# Patient Record
Sex: Male | Born: 1975
Health system: Southern US, Community
[De-identification: ages and names within clinical notes are randomized; demographics above are authoritative.]

## PROBLEM LIST (undated history)

## (undated) DIAGNOSIS — Z789 Other specified health status: Secondary | ICD-10-CM

## (undated) DIAGNOSIS — I10 Essential (primary) hypertension: Secondary | ICD-10-CM

## (undated) HISTORY — PX: NO PAST SURGERIES: SHX2092

## (undated) HISTORY — DX: Essential (primary) hypertension: I10

## (undated) HISTORY — DX: Other specified health status: Z78.9

---

## 2015-08-05 ENCOUNTER — Encounter (HOSPITAL_BASED_OUTPATIENT_CLINIC_OR_DEPARTMENT_OTHER): Payer: Self-pay

## 2015-08-05 ENCOUNTER — Emergency Department (HOSPITAL_BASED_OUTPATIENT_CLINIC_OR_DEPARTMENT_OTHER)
Admission: EM | Admit: 2015-08-05 | Discharge: 2015-08-05 | Disposition: A | Discharge status: Home / Self Care | Payer: Self-pay | Attending: Emergency Medicine | Admitting: Emergency Medicine

## 2015-08-05 DIAGNOSIS — L03011 Cellulitis of right finger: Secondary | ICD-10-CM | POA: Insufficient documentation

## 2015-08-05 MED ORDER — CEPHALEXIN 500 MG PO CAPS: 500.0000 mg | ORAL_CAPSULE | Freq: Four times a day (QID) | ORAL | Status: DC

## 2015-08-05 MED ORDER — HYDROCODONE-ACETAMINOPHEN 5-325 MG PO TABS: 1.0000 | ORAL_TABLET | ORAL | Status: DC | PRN

## 2015-08-05 MED ORDER — LIDOCAINE HCL (PF) 1 % IJ SOLN
30.0000 mL | Freq: Once | INTRAMUSCULAR | Status: AC
  Administered 2015-08-05: 30 mL
  Filled 2015-08-05: qty 30

## 2015-08-05 MED ORDER — CEPHALEXIN 250 MG PO CAPS
500.0000 mg | ORAL_CAPSULE | Freq: Once | ORAL | Status: AC
  Administered 2015-08-05: 500 mg via ORAL
  Filled 2015-08-05: qty 2

## 2015-08-05 MED FILL — CEPHALEXIN 500 MG CAPSULE: 500 | 5 days supply | Qty: 20 | Fill #0

## 2015-08-05 MED FILL — HYDROCODON-APAP 5-325: 5-325 | 2 days supply | Qty: 6 | Fill #0

## 2015-08-05 NOTE — ED Provider Notes (Signed)
CSN: [id number]     Arrival date & time 08/05/15  1500 History   First MD Initiated Contact with Patient 08/05/15 [phone]     Chief Complaint  Patient presents with  . Nail Problem      HPI  Impression presents for evaluation of a red sore swollen area adjacent his right thumbnail. He does not bite his nails. No past summer episodes. No drainage.  History reviewed. No pertinent past medical history. History reviewed. No pertinent past surgical history. No family history on file. Social History  Substance Use Topics  . Smoking status: Never Smoker   . Smokeless tobacco: None  . Alcohol Use: No    Review of Systems  Constitutional: Negative for fever, chills, diaphoresis, appetite change and fatigue.  HENT: Negative for mouth sores, sore throat and trouble swallowing.   Eyes: Negative for visual disturbance.  Respiratory: Negative for cough, chest tightness, shortness of breath and wheezing.   Cardiovascular: Negative for chest pain.  Gastrointestinal: Negative for nausea, vomiting, abdominal pain, diarrhea and abdominal distention.  Endocrine: Negative for polydipsia, polyphagia and polyuria.  Genitourinary: Negative for dysuria, frequency and hematuria.  Musculoskeletal: Negative for gait problem.  Skin: Positive for color change. Negative for pallor and rash.  Neurological: Negative for dizziness, syncope, light-headedness and headaches.  Hematological: Does not bruise/bleed easily.  Psychiatric/Behavioral: Negative for behavioral problems and confusion.      Allergies  Review of patient's allergies indicates no known allergies.  Home Medications   Prior to Admission medications   Medication Sig Start Date End Date Taking? Authorizing Provider  cephALEXin (KEFLEX) 500 MG capsule Take 1 capsule (500 mg total) by mouth 4 (four) times daily. 08/05/15   Rolland Porter, MD  HYDROcodone-acetaminophen (NORCO/VICODIN) 5-325 MG tablet Take 1 tablet by mouth every 4 (four) hours as  needed. 08/05/15   Rolland Porter, MD   BP 150/102 mmHg  Pulse 88  Temp(Src) 98.4 F (36.9 C) (Oral)  Resp 16  Ht  (1.803 m)  Wt 227 lb (102.967 kg)  BMI 31.67 kg/m2  SpO2 100% Physical Exam  Constitutional: He is oriented to person, place, and time. He appears well-developed and well-nourished. No distress.  HENT:  Head: Normocephalic.  Eyes: Conjunctivae are normal. Pupils are equal, round, and reactive to light. No scleral icterus.  Neck: Normal range of motion. Neck supple. No thyromegaly present.  Cardiovascular: Normal rate and regular rhythm.  Exam reveals no gallop and no friction rub.   No murmur heard. Pulmonary/Chest: Effort normal and breath sounds normal. No respiratory distress. He has no wheezes. He has no rales.  Abdominal: Soft. Bowel sounds are normal. He exhibits no distension. There is no tenderness. There is no rebound.  Musculoskeletal: Normal range of motion.       Hands: Neurological: He is alert and oriented to person, place, and time.  Skin: Skin is warm and dry. No rash noted.  Psychiatric: He has a normal mood and affect. His behavior is normal.    ED Course  Procedures (including critical care time) Labs Review Labs Reviewed - No data to display  Imaging Review No results found. I have personally reviewed and evaluated these images and lab results as part of my medical decision-making.   EKG Interpretation None      MDM   Final diagnoses:  Paronychia, right    INCISION AND DRAINAGE Performed by: Claudean Kinds Consent: Verbal consent obtained. Risks and benefits: risks, benefits and alternatives were discussed Type: abscess  Body area: right thumb paronychium    Anesthesia: local infiltration  Incision was made with a scalpel.  Local anesthetic: lidocaine 1% c epinephrine  Anesthetic total: 4 ml  Complexity: complex Blunt dissection to break up loculations  Drainage: purulent  Drainage amount: scant  Packing  material: 1/4 in iodoform gauze  Patient tolerance: Patient tolerated the procedure well with no immediate complications.       Rolland Porter, MD 08/05/15 [phone]

## 2015-08-05 NOTE — Discharge Instructions (Signed)
Soak and remove the gauze in 24 hours.  Paronychia  Paronychia is an infection of the skin. It happens near a fingernail or toenail. It may cause pain and swelling around the nail. Usually, it is not serious and it clears up with treatment. HOME CARE  Soak the fingers or toes in warm water as told by your doctor. You may be told to do this for 20 minutes, 2-3 times a day.  Keep the area dry when you are not soaking it.  Take medicines only as told by your doctor.  If you were given an antibiotic medicine, finish all of it even if you start to feel better.  Keep the affected area clean.  Do not try to drain a fluid-filled bump yourself.  Wear rubber gloves when putting your hands in water.  Wear gloves if your hands might touch cleaners or chemicals.  Follow your doctor's instructions about:  Wound care.  Bandage (dressing) changes and removal. GET HELP IF:  Your symptoms get worse or do not improve.  You have a fever or chills.  You have redness spreading from the affected area.  You have more fluid, blood, or pus coming from the affected area.  Your finger or knuckle is swollen or is hard to move.   This information is not intended to replace advice given to you by your health care provider. Make sure you discuss any questions you have with your health care provider.   Document Released: 06/08/2009 Document Revised: 11/04/2014 Document Reviewed: 05/28/2014 Elsevier Interactive Patient Education [employer]

## 2015-08-05 NOTE — ED Notes (Signed)
Redness, swelling to right thumb nail area

## 2018-08-13 ENCOUNTER — Other Ambulatory Visit: Payer: Self-pay

## 2018-08-13 ENCOUNTER — Emergency Department (HOSPITAL_BASED_OUTPATIENT_CLINIC_OR_DEPARTMENT_OTHER): Payer: BLUE CROSS/BLUE SHIELD

## 2018-08-13 ENCOUNTER — Encounter (HOSPITAL_BASED_OUTPATIENT_CLINIC_OR_DEPARTMENT_OTHER): Payer: Self-pay

## 2018-08-13 ENCOUNTER — Emergency Department (HOSPITAL_BASED_OUTPATIENT_CLINIC_OR_DEPARTMENT_OTHER)
Admission: EM | Admit: 2018-08-13 | Discharge: 2018-08-13 | Disposition: A | Discharge status: Home / Self Care | Payer: BLUE CROSS/BLUE SHIELD | Attending: Emergency Medicine | Admitting: Emergency Medicine

## 2018-08-13 DIAGNOSIS — Z79899 Other long term (current) drug therapy: Secondary | ICD-10-CM | POA: Diagnosis not present

## 2018-08-13 DIAGNOSIS — R05 Cough: Secondary | ICD-10-CM | POA: Diagnosis not present

## 2018-08-13 DIAGNOSIS — J069 Acute upper respiratory infection, unspecified: Secondary | ICD-10-CM | POA: Diagnosis not present

## 2018-08-13 NOTE — ED Triage Notes (Signed)
Pt arrives POV. Last Wednesday, pt began having body aches, congestion, runny nose. Unproductive cough. No sick contacts. Felt worse today and decided to come get checked.

## 2018-08-13 NOTE — ED Provider Notes (Signed)
[facility] EMERGENCY DEPARTMENT Provider Note   CSN: [id number] Arrival date & time: 08/13/18  7829     History   Chief Complaint Chief Complaint  Patient presents with  . Cough    HPI Tony Thompson is a 43 y.o. male.  HPI  Patient presents with cough.  He has had for the last few days.  Nonproduction.  States he was in the hospital when his wife was treated for an abscess otherwise no direct contacts.  No fevers.  States he feels a little bit of cracking in his chest when he coughs.  Some dull pain.  Has had some muscle aches.  Does not smoke.  Otherwise healthy.  States his blood pressure is been high when he goes to the doctor in the past but usually normalizes after being there for a while.  States he thinks he gets nervous.  History reviewed. No pertinent past medical history.  There are no active problems to display for this patient.   History reviewed. No pertinent surgical history.      Home Medications    Prior to Admission medications   Medication Sig Start Date End Date Taking? Authorizing Provider  cephALEXin (KEFLEX) 500 MG capsule Take 1 capsule (500 mg total) by mouth 4 (four) times daily. 08/05/15   Rolland Porter, MD  HYDROcodone-acetaminophen (NORCO/VICODIN) 5-325 MG tablet Take 1 tablet by mouth every 4 (four) hours as needed. 08/05/15   Rolland Porter, MD    Family History History reviewed. No pertinent family history.  Social History Social History   Tobacco Use  . Smoking status: Never Smoker  Substance Use Topics  . Alcohol use: Yes    Comment: occ  . Drug use: No     Allergies   Patient has no known allergies.   Review of Systems Review of Systems  Constitutional: Negative for appetite change.  HENT: Negative for congestion.   Respiratory: Positive for cough and shortness of breath.   Cardiovascular: Negative for chest pain.  Gastrointestinal: Negative for abdominal pain.  Musculoskeletal: Positive for myalgias. Negative for  back pain.  Skin: Negative for rash.  Neurological: Negative for seizures and weakness.  Psychiatric/Behavioral: Negative for confusion.     Physical Exam Updated Vital Signs BP (!) 154/112 (BP Location: Right Arm)   Pulse 86   Temp 98.6 F (37 C) (Oral)   Resp 18   Ht 5\' 10"  (1.778 m)   Wt 112.5 kg   SpO2 100%   BMI 35.58 kg/m   Physical Exam HENT:     Head: Normocephalic.     Mouth/Throat:     Mouth: Mucous membranes are moist.  Cardiovascular:     Rate and Rhythm: Normal rate.  Pulmonary:     Comments: Harsh breath sounds, worse at right base. Abdominal:     Tenderness: There is no abdominal tenderness.  Musculoskeletal:     Right lower leg: No edema.     Left lower leg: No edema.  Skin:    General: Skin is warm.     Capillary Refill: Capillary refill takes less than 2 seconds.  Neurological:     General: No focal deficit present.     Mental Status: He is alert.      ED Treatments / Results  Labs (all labs ordered are listed, but only abnormal results are displayed) Labs Reviewed - No data to display  EKG None  Radiology Dg Chest 2 View  Result Date: 08/13/2018 CLINICAL DATA:  Cough, body  aches, congestion EXAM: CHEST - 2 VIEW COMPARISON:  None. FINDINGS: The heart size and mediastinal contours are within normal limits. Both lungs are clear. The visualized skeletal structures are unremarkable. IMPRESSION: No acute abnormality of the lungs.  No focal airspace opacity. Electronically Signed   By: Lauralyn PrimesAlex  Bibbey M.D.   On: 08/13/2018 09:28    Procedures Procedures (including critical care time)  Medications Ordered in ED Medications - No data to display   Initial Impression / Assessment and Plan / ED Course  I have reviewed the triage vital signs and the nursing notes.  Pertinent labs & imaging results that were available during my care of the patient were reviewed by me and considered in my medical decision making (see chart for details).      Patient with cough.  URI symptoms.  X-ray reassuring.  Well-appearing.  Discharge home.  Symptomatic treatment.  Blood pressure needs to be followed as an outpatient also and was elevated here.  Final Clinical Impressions(s) / ED Diagnoses   Final diagnoses:  Upper respiratory tract infection, unspecified type    ED Discharge Orders    None       Benjiman CorePickering, Dalyn Kjos, MD 08/13/18 1023

## 2018-08-13 NOTE — ED Notes (Signed)
ED Provider at bedside. 

## 2019-10-03 ENCOUNTER — Ambulatory Visit (INDEPENDENT_AMBULATORY_CARE_PROVIDER_SITE_OTHER): Payer: BC Managed Care – PPO | Admitting: Family Medicine

## 2019-10-03 ENCOUNTER — Other Ambulatory Visit: Payer: Self-pay

## 2019-10-03 ENCOUNTER — Encounter: Payer: Self-pay | Admitting: Family Medicine

## 2019-10-03 VITALS — BP 148/104 | HR 90 | Temp 96.0°F | Ht 70.0 in | Wt 245.4 lb

## 2019-10-03 DIAGNOSIS — R030 Elevated blood-pressure reading, without diagnosis of hypertension: Secondary | ICD-10-CM

## 2019-10-03 DIAGNOSIS — Z0000 Encounter for general adult medical examination without abnormal findings: Secondary | ICD-10-CM | POA: Diagnosis not present

## 2019-10-03 DIAGNOSIS — K649 Unspecified hemorrhoids: Secondary | ICD-10-CM

## 2019-10-03 DIAGNOSIS — Z114 Encounter for screening for human immunodeficiency virus [HIV]: Secondary | ICD-10-CM

## 2019-10-03 LAB — CBC
HCT: 43.8 % (ref 38.5–50.0)
Hemoglobin: 13.7 g/dL (ref 13.2–17.1)
MCH: 21 pg — ABNORMAL LOW (ref 27.0–33.0)
MCHC: 31.3 g/dL — ABNORMAL LOW (ref 32.0–36.0)
MCV: 67.3 fL — ABNORMAL LOW (ref 80.0–100.0)
MPV: 11.4 fL (ref 7.5–12.5)
Platelets: 292 10*3/uL (ref 140–400)
RBC: 6.51 10*6/uL — ABNORMAL HIGH (ref 4.20–5.80)
RDW: 17.5 % — ABNORMAL HIGH (ref 11.0–15.0)
WBC: 7.8 10*3/uL (ref 3.8–10.8)

## 2019-10-03 LAB — COMPREHENSIVE METABOLIC PANEL
AG Ratio: 1.7 (calc) (ref 1.0–2.5)
ALT: 34 U/L (ref 9–46)
AST: 28 U/L (ref 10–40)
Albumin: 4.3 g/dL (ref 3.6–5.1)
Alkaline phosphatase (APISO): 75 U/L (ref 36–130)
BUN: 12 mg/dL (ref 7–25)
CO2: 29 mmol/L (ref 20–32)
Calcium: 9.4 mg/dL (ref 8.6–10.3)
Chloride: 104 mmol/L (ref 98–110)
Creat: 1.3 mg/dL (ref 0.60–1.35)
Globulin: 2.6 g/dL (calc) (ref 1.9–3.7)
Glucose, Bld: 114 mg/dL — ABNORMAL HIGH (ref 65–99)
Potassium: 3.9 mmol/L (ref 3.5–5.3)
Sodium: 141 mmol/L (ref 135–146)
Total Bilirubin: 0.4 mg/dL (ref 0.2–1.2)
Total Protein: 6.9 g/dL (ref 6.1–8.1)

## 2019-10-03 LAB — LIPID PANEL
Cholesterol: 201 mg/dL — ABNORMAL HIGH (ref <200)
HDL: 35 mg/dL — ABNORMAL LOW (ref >=40)
LDL Cholesterol (Calc): 131 mg/dL (calc) — ABNORMAL HIGH
Non-HDL Cholesterol (Calc): 166 mg/dL (calc) — ABNORMAL HIGH (ref <130)
Total CHOL/HDL Ratio: 5.7 (calc) — ABNORMAL HIGH (ref <5.0)
Triglycerides: 210 mg/dL — ABNORMAL HIGH (ref <150)

## 2019-10-03 MED ORDER — HYDROCORTISONE (PERIANAL) 2.5 % EX CREA: 1.0000 "application " | TOPICAL_CREAM | Freq: Two times a day (BID) | CUTANEOUS | 0 refills | Status: DC

## 2019-10-03 MED ORDER — NITROGLYCERIN 0.4 % RE OINT: TOPICAL_OINTMENT | RECTAL | 0 refills | Status: DC

## 2019-10-03 NOTE — Progress Notes (Signed)
Chief Complaint  Patient presents with  . New Patient (Initial Visit)    Well Male Tony Thompson is here for a complete physical.   His last physical was >1 year ago.  Current diet: in general, a "healthy" diet.   Current exercise: treadmill work, wt resistance exercise Weight trend: Has lost intentionally Daytime fatigue? No. Seat belt? Yes.    Health maintenance Tetanus- No HIV- No   Pt w hx of high BP in doc's offices. He does ck BP at home, does not remember readings. Diet/exercise as above.  Pt w hx of hemorrhoids over past 6 mo. Using otc prep H. BM's are better. Sometimes will have blood. No bulges or masses.   Past Medical History:  Diagnosis Date  . No known health problems      Past Surgical History:  Procedure Laterality Date  . NO PAST SURGERIES      Medications  Takes no meds routinely.    Allergies No Known Allergies  Family History Family History  Problem Relation Age of Onset  . Cancer Neg Hx   . Heart disease Neg Hx     Review of Systems: Constitutional: no fevers or chills Eye:  no recent significant change in vision Ear/Nose/Mouth/Throat:  Ears:  no hearing loss Nose/Mouth/Throat:  no complaints of nasal congestion, no sore throat Cardiovascular:  no chest pain Respiratory:  no shortness of breath Gastrointestinal:  no abdominal pain, no change in bowel habits; +hemorrhoid GU:  Male: negative for dysuria, frequency, and incontinence Musculoskeletal/Extremities:  no pain of the joints Integumentary (Skin/Breast):  no abnormal skin lesions reported Neurologic:  no headaches Endocrine: No unexpected weight changes Hematologic/Lymphatic:  no night sweats  Exam BP (!) 148/104 (BP Location: Right Arm, Patient Position: Sitting, Cuff Size: Large)   Pulse 90   Temp (!) 96 F (35.6 C) (Temporal)   Ht 5\' 10"  (1.778 m)   Wt 245 lb 6 oz (111.3 kg)   SpO2 97%   BMI 35.21 kg/m  General:  well developed, well nourished, in no apparent  distress Skin:  no significant moles, warts, or growths Head:  no masses, lesions, or tenderness Eyes:  pupils equal and round, sclera anicteric without injection Ears:  canals without lesions, TMs shiny without retraction, no obvious effusion, no erythema Nose:  nares patent, septum midline, mucosa normal Throat/Pharynx:  lips and gingiva without lesion; tongue and uvula midline; non-inflamed pharynx; no exudates or postnasal drainage Neck: neck supple without adenopathy, thyromegaly, or masses Lungs:  clear to auscultation, breath sounds equal bilaterally, no respiratory distress Cardio:  regular rate and rhythm, no bruits, no LE edema Abdomen:  abdomen soft, nontender; bowel sounds normal; no masses or organomegaly Rectal: small ext hem noted R lat portion of anal opening; no thrombosed lesions Musculoskeletal:  symmetrical muscle groups noted without atrophy or deformity Extremities:  no clubbing, cyanosis, or edema, no deformities, no skin discoloration Neuro:  gait normal; deep tendon reflexes normal and symmetric Psych: well oriented with normal range of affect and appropriate judgment/insight  Assessment and Plan  Well adult exam - Plan: CBC, Comprehensive metabolic panel, Lipid panel, CANCELED: CBC, CANCELED: Comprehensive metabolic panel, CANCELED: Lipid panel  Screening for HIV (human immunodeficiency virus) - Plan: HIV Antibody (routine testing w rflx)  Elevated blood pressure reading  Hemorrhoids, unspecified hemorrhoid type - Plan: Nitroglycerin 0.4 % OINT, hydrocortisone (ANUSOL-HC) 2.5 % rectal cream   Well 44 y.o. male. Counseled on diet and exercise. Counseled on risks and benefits of prostate cancer  screening with PSA. The patient agrees to forego screening.  F/u in 2 weeks for nurse visit to reck BP. Will get tdap 2 weeks after his 2nd covid vaccination which he plans to get soon.  Cream as above. If no improvement, will refer to GI.  Other orders as  above. Follow up in 1 year pending the above workup and BP results. The patient voiced understanding and agreement to the plan.  Jilda Roche [place], DO 10/03/19 3:35 PM

## 2019-10-03 NOTE — Patient Instructions (Addendum)
Keep the diet clean and stay active.  Give [place] 2-3 business days to get the results of your labs back.   Get your tetanus shot 2 weeks after your second covid vaccination.   Let us know if you need anything.   Hemorrhoids Hemorrhoids are swollen veins in and around the rectum or anus. There are two types of hemorrhoids:  Internal hemorrhoids. These occur in the veins that are just inside the rectum. They may poke through to the outside and become irritated and painful.  External hemorrhoids. These occur in the veins that are outside the anus and can be felt as a painful swelling or hard lump near the anus. Most hemorrhoids do not cause serious problems, and they can be managed with home treatments such as diet and lifestyle changes. If home treatments do not help the symptoms, procedures can be done to shrink or remove the hemorrhoids. What are the causes? This condition is caused by increased pressure in the anal area. This pressure may result from various things, including:  Constipation.  Straining to have a bowel movement.  Diarrhea.  Pregnancy.  Obesity.  Sitting for long periods of time.  Heavy lifting or other activity that causes you to strain.  Anal sex.  Riding a bike for a long period of time. What are the signs or symptoms? Symptoms of this condition include:  Pain.  Anal itching or irritation.  Rectal bleeding.  Leakage of stool (feces).  Anal swelling.  One or more lumps around the anus. How is this diagnosed? This condition can often be diagnosed through a visual exam. Other exams or tests may also be done, such as:  An exam that involves feeling the rectal area with a gloved hand (digital rectal exam).  An exam of the anal canal that is done using a small tube (anoscope).  A blood test, if you have lost a significant amount of blood.  A test to look inside the colon using a flexible tube with a camera on the end (sigmoidoscopy or  colonoscopy). How is this treated? This condition can usually be treated at home. However, various procedures may be done if dietary changes, lifestyle changes, and other home treatments do not help your symptoms. These procedures can help make the hemorrhoids smaller or remove them completely. Some of these procedures involve surgery, and others do not. Common procedures include:  Rubber band ligation. Rubber bands are placed at the base of the hemorrhoids to cut off their blood supply.  Sclerotherapy. Medicine is injected into the hemorrhoids to shrink them.  Infrared coagulation. A type of light energy is used to get rid of the hemorrhoids.  Hemorrhoidectomy surgery. The hemorrhoids are surgically removed, and the veins that supply them are tied off.  Stapled hemorrhoidopexy surgery. The surgeon staples the base of the hemorrhoid to the rectal wall. Follow these instructions at home: Eating and drinking   Eat foods that have a lot of fiber in them, such as whole grains, beans, nuts, fruits, and vegetables.  Ask your health care provider about taking products that have added fiber (fiber supplements).  Reduce the amount of fat in your diet. You can do this by eating low-fat dairy products, eating less red meat, and avoiding processed foods.  Drink enough fluid to keep your urine pale yellow. Managing pain and swelling   Take warm sitz baths for 20 minutes, 3-4 times a day to ease pain and discomfort. You may do this in a bathtub or using a  portable sitz bath that fits over the toilet.  If directed, apply ice to the affected area. Using ice packs between sitz baths may be helpful. ? Put ice in a plastic bag. ? Place a towel between your skin and the bag. ? Leave the ice on for 20 minutes, 2-3 times a day. General instructions  Take over-the-counter and prescription medicines only as told by your health care provider.  Use medicated creams or suppositories as told.  Get regular  exercise. Ask your health care provider how much and what kind of exercise is best for you. In general, you should do moderate exercise for at least 30 minutes on most days of the week (150 minutes each week). This can include activities such as walking, biking, or yoga.  Go to the bathroom when you have the urge to have a bowel movement. Do not wait.  Avoid straining to have bowel movements.  Keep the anal area dry and clean. Use wet toilet paper or moist towelettes after a bowel movement.  Do not sit on the toilet for long periods of time. This increases blood pooling and pain.  Keep all follow-up visits as told by your health care provider. This is important. Contact a health care provider if you have:  Increasing pain and swelling that are not controlled by treatment or medicine.  Difficulty having a bowel movement, or you are unable to have a bowel movement.  Pain or inflammation outside the area of the hemorrhoids. Get help right away if you have:  Uncontrolled bleeding from your rectum. Summary  Hemorrhoids are swollen veins in and around the rectum or anus.  Most hemorrhoids can be managed with home treatments such as diet and lifestyle changes.  Taking warm sitz baths can help ease pain and discomfort.  In severe cases, procedures or surgery can be done to shrink or remove the hemorrhoids. This information is not intended to replace advice given to you by your health care provider. Make sure you discuss any questions you have with your health care provider. Document Revised: 11/16/2018 Document Reviewed: 11/09/2017 Elsevier Patient Education  2020 [employer].

## 2019-10-04 LAB — HIV ANTIBODY (ROUTINE TESTING W REFLEX): HIV 1&2 Ab, 4th Generation: NONREACTIVE

## 2019-10-07 ENCOUNTER — Other Ambulatory Visit: Payer: Self-pay | Admitting: Family Medicine

## 2019-10-07 DIAGNOSIS — R739 Hyperglycemia, unspecified: Secondary | ICD-10-CM

## 2019-10-07 DIAGNOSIS — E611 Iron deficiency: Secondary | ICD-10-CM

## 2019-10-07 DIAGNOSIS — R718 Other abnormality of red blood cells: Secondary | ICD-10-CM

## 2019-10-07 DIAGNOSIS — E785 Hyperlipidemia, unspecified: Secondary | ICD-10-CM

## 2019-10-17 ENCOUNTER — Ambulatory Visit: Payer: BC Managed Care – PPO

## 2019-11-20 ENCOUNTER — Other Ambulatory Visit: Payer: BC Managed Care – PPO

## 2020-10-05 ENCOUNTER — Encounter: Payer: Self-pay | Admitting: Family Medicine

## 2020-10-05 ENCOUNTER — Ambulatory Visit (INDEPENDENT_AMBULATORY_CARE_PROVIDER_SITE_OTHER): Payer: BC Managed Care – PPO | Admitting: Family Medicine

## 2020-10-05 ENCOUNTER — Other Ambulatory Visit: Payer: Self-pay

## 2020-10-05 VITALS — BP 136/82 | HR 60 | Temp 98.2°F | Ht 71.0 in | Wt 256.5 lb

## 2020-10-05 DIAGNOSIS — R739 Hyperglycemia, unspecified: Secondary | ICD-10-CM

## 2020-10-05 DIAGNOSIS — Z3009 Encounter for other general counseling and advice on contraception: Secondary | ICD-10-CM

## 2020-10-05 DIAGNOSIS — Z125 Encounter for screening for malignant neoplasm of prostate: Secondary | ICD-10-CM | POA: Diagnosis not present

## 2020-10-05 DIAGNOSIS — R718 Other abnormality of red blood cells: Secondary | ICD-10-CM | POA: Diagnosis not present

## 2020-10-05 DIAGNOSIS — K644 Residual hemorrhoidal skin tags: Secondary | ICD-10-CM

## 2020-10-05 DIAGNOSIS — Z0000 Encounter for general adult medical examination without abnormal findings: Secondary | ICD-10-CM | POA: Diagnosis not present

## 2020-10-05 DIAGNOSIS — Z1159 Encounter for screening for other viral diseases: Secondary | ICD-10-CM | POA: Diagnosis not present

## 2020-10-05 NOTE — Progress Notes (Signed)
Chief Complaint  Patient presents with  . Annual Exam    Well Male Tony Thompson is here for a complete physical.   His last physical was >1 year ago.  Current diet: in general, diet is fair.   Current exercise: running, walking, stretching, wt lifting Weight trend: gained osme Fatigue out of ordinary? No. Seat belt? Yes.    External hemorrhoids The patient has a longstanding history of external hemorrhoids.  He was prescribed topical nitro ointment and 2.5% hydrocortisone last year.  Only the latter was affordable which did help initially.  Over the past several months it is gotten worse again.  He is requesting to see a specialist.  Bowel movements have been normal overall.  Health maintenance Tetanus- Yes HIV- Yes Hep C- No  Past Medical History:  Diagnosis Date  . No known health problems      Past Surgical History:  Procedure Laterality Date  . NO PAST SURGERIES      Medications  Takes no meds routinely.   Allergies No Known Allergies  Family History Family History  Problem Relation Age of Onset  . Cancer Neg Hx   . Heart disease Neg Hx     Review of Systems: Constitutional: no fevers or chills Eye:  no recent significant change in vision Ear/Nose/Mouth/Throat:  Ears:  no hearing loss Nose/Mouth/Throat:  no complaints of nasal congestion, no sore throat Cardiovascular:  no chest pain Respiratory:  no shortness of breath Gastrointestinal:  +painful ext hemorrhoids GU:  Male: negative for dysuria, frequency, and incontinence Musculoskeletal/Extremities:  no pain of the joints Integumentary (Skin/Breast):  no abnormal skin lesions reported Neurologic:  no headaches Endocrine: No unexpected weight changes Hematologic/Lymphatic:  no night sweats  Exam BP 136/82 (BP Location: Left Arm, Patient Position: Sitting, Cuff Size: Large)   Pulse 60   Temp 98.2 F (36.8 C) (Oral)   Ht 5\' 11"  (1.803 m)   Wt 256 lb 8 oz (116.3 kg)   SpO2 99%   BMI 35.77 kg/m   General:  well developed, well nourished, in no apparent distress Skin:  no significant moles, warts, or growths Head:  no masses, lesions, or tenderness Eyes:  pupils equal and round, sclera anicteric without injection Ears:  canals without lesions, TMs shiny without retraction, no obvious effusion, no erythema Nose:  nares patent, septum midline, mucosa normal Throat/Pharynx:  lips and gingiva without lesion; tongue and uvula midline; non-inflamed pharynx; no exudates or postnasal drainage Neck: neck supple without adenopathy, thyromegaly, or masses Lungs:  clear to auscultation, breath sounds equal bilaterally, no respiratory distress Cardio:  regular rate and rhythm, no bruits, no LE edema Abdomen:  abdomen soft, nontender; bowel sounds normal; no masses or organomegaly Rectal: Deferred Musculoskeletal:  symmetrical muscle groups noted without atrophy or deformity Extremities:  no clubbing, cyanosis, or edema, no deformities, no skin discoloration Neuro:  gait normal; deep tendon reflexes normal and symmetric Psych: well oriented with normal range of affect and appropriate judgment/insight  Assessment and Plan  Well adult exam - Plan: Lipid panel  Microcytosis - Plan: CBC, IBC + Ferritin  Hyperglycemia - Plan: Comprehensive metabolic panel, Hemoglobin A1c  External hemorrhoid - Plan: Ambulatory referral to Gastroenterology  Screening for prostate cancer - Plan: PSA  Encounter for hepatitis C screening test for low risk patient - Plan: Hepatitis C antibody  Vasectomy evaluation - Plan: Ambulatory referral to Urology   Well 45 y.o. male. Counseled on diet and exercise. Counseled on risks and benefits of prostate cancer  screening with PSA. The patient agrees to undergo screening.  Recheck above labs. Patient requests referral to urology for a vasectomy. With his hemorrhoids, will recommend seeing Dr. Barron Alvine of the GI team. Follow up in 1 yr pending the above workup. The  patient voiced understanding and agreement to the plan.  Jilda Roche [place], DO 10/05/20 3:36 PM

## 2020-10-05 NOTE — Patient Instructions (Addendum)
Goal blood pressure <140/90. Continue to check blood pressures at home intermittently. If it increases, start checking 2-3 times per week and vary the time of day that you check it.   If you do not hear anything about your referrals in the next 1-2 weeks, call our office and ask for an update.  Keep the diet clean and stay active.  Give [place] 2-3 business days to get the results of your labs back.   Let us know if you need anything.

## 2020-10-06 LAB — COMPREHENSIVE METABOLIC PANEL
ALT: 26 U/L (ref 0–53)
AST: 24 U/L (ref 0–37)
Albumin: 4.4 g/dL (ref 3.5–5.2)
Alkaline Phosphatase: 73 U/L (ref 39–117)
BUN: 16 mg/dL (ref 6–23)
CO2: 29 mEq/L (ref 19–32)
Calcium: 9.6 mg/dL (ref 8.4–10.5)
Chloride: 103 mEq/L (ref 96–112)
Creatinine, Ser: 1.17 mg/dL (ref 0.40–1.50)
GFR: 76.16 mL/min (ref >60.00)
Glucose, Bld: 93 mg/dL (ref 70–99)
Potassium: 4.1 mEq/L (ref 3.5–5.1)
Sodium: 141 mEq/L (ref 135–145)
Total Bilirubin: 0.4 mg/dL (ref 0.2–1.2)
Total Protein: 7 g/dL (ref 6.0–8.3)

## 2020-10-06 LAB — CBC
HCT: 41.7 % (ref 39.0–52.0)
Hemoglobin: 13.5 g/dL (ref 13.0–17.0)
MCHC: 32.5 g/dL (ref 30.0–36.0)
MCV: 66.1 fl — ABNORMAL LOW (ref 78.0–100.0)
Platelets: 232 10*3/uL (ref 150.0–400.0)
RBC: 6.3 Mil/uL — ABNORMAL HIGH (ref 4.22–5.81)
RDW: 15.4 % (ref 11.5–15.5)
WBC: 8 10*3/uL (ref 4.0–10.5)

## 2020-10-06 LAB — LIPID PANEL
Cholesterol: 195 mg/dL (ref 0–200)
HDL: 35.4 mg/dL — ABNORMAL LOW (ref >39.00)
NonHDL: 159.71
Total CHOL/HDL Ratio: 6
Triglycerides: 233 mg/dL — ABNORMAL HIGH (ref 0.0–149.0)
VLDL: 46.6 mg/dL — ABNORMAL HIGH (ref 0.0–40.0)

## 2020-10-06 LAB — IBC + FERRITIN
Ferritin: 83.8 ng/mL (ref 22.0–322.0)
Iron: 63 ug/dL (ref 42–165)
Saturation Ratios: 21.7 % (ref 20.0–50.0)
Transferrin: 207 mg/dL — ABNORMAL LOW (ref 212.0–360.0)

## 2020-10-06 LAB — PSA: PSA: 0.79 ng/mL (ref 0.10–4.00)

## 2020-10-06 LAB — HEPATITIS C ANTIBODY
Hepatitis C Ab: NONREACTIVE
SIGNAL TO CUT-OFF: 0.03 (ref <1.00)

## 2020-10-06 LAB — HEMOGLOBIN A1C: Hgb A1c MFr Bld: 6.2 % (ref 4.6–6.5)

## 2020-10-06 LAB — LDL CHOLESTEROL, DIRECT: Direct LDL: 131 mg/dL

## 2020-10-07 ENCOUNTER — Other Ambulatory Visit: Payer: Self-pay | Admitting: Family Medicine

## 2020-10-07 DIAGNOSIS — K644 Residual hemorrhoidal skin tags: Secondary | ICD-10-CM

## 2020-11-10 ENCOUNTER — Ambulatory Visit: Payer: BC Managed Care – PPO | Admitting: Family Medicine

## 2020-11-17 DIAGNOSIS — M9903 Segmental and somatic dysfunction of lumbar region: Secondary | ICD-10-CM | POA: Diagnosis not present

## 2020-11-17 DIAGNOSIS — M5413 Radiculopathy, cervicothoracic region: Secondary | ICD-10-CM | POA: Diagnosis not present

## 2020-11-17 DIAGNOSIS — M9901 Segmental and somatic dysfunction of cervical region: Secondary | ICD-10-CM | POA: Diagnosis not present

## 2020-11-17 DIAGNOSIS — M5417 Radiculopathy, lumbosacral region: Secondary | ICD-10-CM | POA: Diagnosis not present

## 2020-11-24 DIAGNOSIS — K641 Second degree hemorrhoids: Secondary | ICD-10-CM | POA: Diagnosis not present

## 2020-12-29 DIAGNOSIS — Z3009 Encounter for other general counseling and advice on contraception: Secondary | ICD-10-CM | POA: Diagnosis not present

## 2021-02-10 ENCOUNTER — Encounter (HOSPITAL_BASED_OUTPATIENT_CLINIC_OR_DEPARTMENT_OTHER): Payer: Self-pay | Admitting: Emergency Medicine

## 2021-02-10 ENCOUNTER — Emergency Department (HOSPITAL_BASED_OUTPATIENT_CLINIC_OR_DEPARTMENT_OTHER): Payer: BC Managed Care – PPO

## 2021-02-10 ENCOUNTER — Other Ambulatory Visit: Payer: Self-pay

## 2021-02-10 ENCOUNTER — Emergency Department (HOSPITAL_BASED_OUTPATIENT_CLINIC_OR_DEPARTMENT_OTHER)
Admission: EM | Admit: 2021-02-10 | Discharge: 2021-02-10 | Disposition: A | Discharge status: Home / Self Care | Payer: BC Managed Care – PPO | Attending: Emergency Medicine | Admitting: Emergency Medicine

## 2021-02-10 DIAGNOSIS — W208XXA Other cause of strike by thrown, projected or falling object, initial encounter: Secondary | ICD-10-CM | POA: Insufficient documentation

## 2021-02-10 DIAGNOSIS — S9031XA Contusion of right foot, initial encounter: Secondary | ICD-10-CM | POA: Diagnosis not present

## 2021-02-10 DIAGNOSIS — Y990 Civilian activity done for income or pay: Secondary | ICD-10-CM | POA: Diagnosis not present

## 2021-02-10 DIAGNOSIS — T148XXA Other injury of unspecified body region, initial encounter: Secondary | ICD-10-CM

## 2021-02-10 DIAGNOSIS — S99921A Unspecified injury of right foot, initial encounter: Secondary | ICD-10-CM | POA: Insufficient documentation

## 2021-02-10 DIAGNOSIS — Y93B3 Activity, free weights: Secondary | ICD-10-CM | POA: Diagnosis not present

## 2021-02-10 DIAGNOSIS — M7989 Other specified soft tissue disorders: Secondary | ICD-10-CM | POA: Diagnosis not present

## 2021-02-10 DIAGNOSIS — S99929A Unspecified injury of unspecified foot, initial encounter: Secondary | ICD-10-CM

## 2021-02-10 NOTE — ED Notes (Signed)
Pt provided discharge instructions and prescription information. Pt was given the opportunity to ask questions and questions were answered. Discharge signature not obtained in the setting of the COVID-19 pandemic in order to reduce high touch surfaces.  ° °

## 2021-02-10 NOTE — Discharge Instructions (Addendum)
Get help right away if: You suddenly develop severe pain in your foot. You previously had sensation in your foot and you suddenly lose sensation. Your symptoms had improved and they suddenly get worse. Your foot or toes are turning pink or blue.

## 2021-02-10 NOTE — ED Triage Notes (Signed)
Pt reports dumbbell broke last night while working out.Pt reports dumbbell was approximately 50 lbs and reports half fell on right foot. No obvious deformity noted, moderate swelling noted to top of right foot.

## 2021-02-10 NOTE — ED Provider Notes (Signed)
[facility] EMERGENCY DEPARTMENT Provider Note   CSN: [id number] Arrival date & time: 02/10/21  1019     History Chief Complaint  Patient presents with   Foot Pain    Tony Thompson is a 45 y.o. male   Foot Pain This is a new problem. The current episode started 12 to 24 hours ago. The problem occurs constantly. The problem has not changed since onset.Pertinent negatives include no chest pain, no abdominal pain, no headaches and no shortness of breath. The symptoms are aggravated by walking. Nothing relieves the symptoms. He has tried a cold compress for the symptoms.      Past Medical History:  Diagnosis Date   No known health problems     There are no problems to display for this patient.   Past Surgical History:  Procedure Laterality Date   NO PAST SURGERIES         Family History  Problem Relation Age of Onset   Cancer Neg Hx    Heart disease Neg Hx     Social History   Tobacco Use   Smoking status: Never   Smokeless tobacco: Never  Substance Use Topics   Alcohol use: Yes    Comment: occ   Drug use: No    Home Medications Prior to Admission medications   Medication Sig Start Date End Date Taking? Authorizing Provider  hydrocortisone (ANUSOL-HC) 2.5 % rectal cream Place 1 application rectally 2 (two) times daily. Patient not taking: Reported on 10/05/2020 10/03/19   Sharlene Dory, DO  Nitroglycerin 0.4 % OINT Apply a thin layer twice daily. Patient not taking: Reported on 10/05/2020 10/03/19   Sharlene Dory, DO    Allergies    Patient has no known allergies.  Review of Systems   Review of Systems  Respiratory:  Negative for shortness of breath.   Cardiovascular:  Negative for chest pain.  Gastrointestinal:  Negative for abdominal pain.  Neurological:  Negative for weakness, numbness and headaches.   Physical Exam Updated Vital Signs BP (!) 152/99 (BP Location: Right Arm)   Pulse 61   Temp 98.2 F (36.8 C) (Oral)    Resp 18   Ht 5\' 11"  (1.803 m)   Wt 109.3 kg   SpO2 100%   BMI 33.61 kg/m   Physical Exam Vitals and nursing note reviewed.  Constitutional:      General: He is not in acute distress.    Appearance: He is well-developed. He is not diaphoretic.  HENT:     Head: Normocephalic and atraumatic.  Eyes:     General: No scleral icterus.    Conjunctiva/sclera: Conjunctivae normal.  Cardiovascular:     Rate and Rhythm: Normal rate and regular rhythm.     Heart sounds: Normal heart sounds.  Pulmonary:     Effort: Pulmonary effort is normal. No respiratory distress.     Breath sounds: Normal breath sounds.  Abdominal:     Palpations: Abdomen is soft.     Tenderness: There is no abdominal tenderness.  Musculoskeletal:     Cervical back: Normal range of motion and neck supple.     Comments: Marked swelling of the dorsum of the right foot mild ecchymosis, normal capillary refill, 2+ distal DP and PT pulse  Skin:    General: Skin is warm and dry.  Neurological:     General: No focal deficit present.     Mental Status: He is alert.  Psychiatric:  Behavior: Behavior normal.    ED Results / Procedures / Treatments   Labs (all labs ordered are listed, but only abnormal results are displayed) Labs Reviewed - No data to display  EKG None  Radiology DG Foot Complete Right  Result Date: 02/10/2021 CLINICAL DATA:  Dumbbell fell on foot EXAM: RIGHT FOOT COMPLETE - 3+ VIEW COMPARISON:  None. FINDINGS: There is no acute fracture or dislocation. Alignment is normal. The Lisfranc and Chopart joints are intact. The joint spaces are preserved. There is marked soft tissue swelling over the dorsum of the foot. IMPRESSION: Marked soft tissue swelling over the dorsum of the foot without underlying fracture or dislocation. Electronically Signed   By: Lesia Hausen MD   On: 02/10/2021 11:22    Procedures Procedures   Medications Ordered in ED Medications - No data to display  ED Course  I  have reviewed the triage vital signs and the nursing notes.  Pertinent labs & imaging results that were available during my care of the patient were reviewed by me and considered in my medical decision making (see chart for details).    MDM Rules/Calculators/A&P                         I personally reviewed the patient's foot x-ray.  No evidence of Lisfranc injury or fracture. Patient has a large hematoma.  Given a postop boot with improvement in his ambulatory status.  Ice and elevation as well as Tylenol Motrin for pain.  No evidence of compartment syndrome, normal DP and PT pulse with good cap refill.  He has normal sensation appears otherwise appropriate for discharge and may follow-up with his primary care physician.   Final Clinical Impression(s) / ED Diagnoses Final diagnoses:  Foot injury    Rx / DC Orders ED Discharge Orders     None        Arthor Captain, PA-C 02/10/21 1500    Gloris Manchester, MD 02/11/21 [phone]

## 2021-02-17 ENCOUNTER — Inpatient Hospital Stay: Payer: BC Managed Care – PPO | Admitting: Family Medicine

## 2021-04-16 DIAGNOSIS — Z302 Encounter for sterilization: Secondary | ICD-10-CM | POA: Diagnosis not present

## 2021-10-06 ENCOUNTER — Ambulatory Visit (INDEPENDENT_AMBULATORY_CARE_PROVIDER_SITE_OTHER): Payer: BC Managed Care – PPO | Admitting: Family Medicine

## 2021-10-06 ENCOUNTER — Encounter: Payer: Self-pay | Admitting: Family Medicine

## 2021-10-06 VITALS — BP 142/88 | HR 47 | Temp 98.0°F | Ht 70.0 in | Wt 250.0 lb

## 2021-10-06 DIAGNOSIS — Z0000 Encounter for general adult medical examination without abnormal findings: Secondary | ICD-10-CM

## 2021-10-06 DIAGNOSIS — I10 Essential (primary) hypertension: Secondary | ICD-10-CM | POA: Diagnosis not present

## 2021-10-06 DIAGNOSIS — Z1211 Encounter for screening for malignant neoplasm of colon: Secondary | ICD-10-CM | POA: Diagnosis not present

## 2021-10-06 DIAGNOSIS — Z125 Encounter for screening for malignant neoplasm of prostate: Secondary | ICD-10-CM

## 2021-10-06 MED ORDER — AMLODIPINE BESYLATE 5 MG PO TABS: 5.0000 mg | ORAL_TABLET | Freq: Every day | ORAL | 3 refills | Status: DC

## 2021-10-06 NOTE — Patient Instructions (Addendum)
Give [place] 2-3 business days to get the results of your labs back.  ? ?Keep the diet clean and stay active. ? ?Please get me a copy of your advanced directive form at your convenience.  ? ?Consider Powerstep insoles. There are very quality over the counter inserts. Shop around online and in stores. Dr. Margart Sickles is a cheaper alternative, though is not as high of quality.  ? ?Check your blood pressures 2-3 times per week, alternating the time of day you check it. If it is high, considering waiting 1-2 minutes and rechecking. If it gets higher, your anxiety is likely creeping up and we should avoid rechecking.  ? ?If you do not hear anything about your referral in the next 1-2 weeks, call our office and ask for an update. ? ?Let us know if you need anything. ? ?

## 2021-10-06 NOTE — Progress Notes (Signed)
Chief Complaint  ?Patient presents with  ? Annual Exam  ? ? ?Well Male ?Tony Thompson is here for a complete physical.   ?His last physical was >1 year ago.  ?Current diet: in general, a "fair" diet.   ?Current exercise: walking ?Weight trend: has gained a little ?Fatigue out of ordinary? No. ?Seat belt? Yes.   ?Advanced directive? No ? ?Health maintenance ?Tetanus- Yes ?HIV- Yes ?Hep C- Yes ? ?Hypertension ?Patient presents for hypertension follow up. ?He does not monitor home blood pressures. ?He is not on medications. ?Patient has these side effects of medication: none ?Diet/exercise as above. ?No Cp or SOB.  ? ?Past Medical History:  ?Diagnosis Date  ? No known health problems   ?  ? ?Past Surgical History:  ?Procedure Laterality Date  ? NO PAST SURGERIES    ? ? ?Medications  ?Takes no meds routinely.   ? ?Allergies ?No Known Allergies ? ?Family History ?Family History  ?Problem Relation Age of Onset  ? Cancer Neg Hx   ? Heart disease Neg Hx   ? ? ?Review of Systems: ?Constitutional: no fevers or chills ?Eye:  no recent significant change in vision ?Ear/Nose/Mouth/Throat:  Ears:  no hearing loss ?Nose/Mouth/Throat:  no complaints of nasal congestion, no sore throat ?Cardiovascular:  no chest pain ?Respiratory:  no shortness of breath ?Gastrointestinal:  no abdominal pain, no change in bowel habits ?GU:  Male: negative for dysuria, frequency, and incontinence ?Musculoskeletal/Extremities:  no pain of the joints ?Integumentary (Skin/Breast):  no abnormal skin lesions reported ?Neurologic:  no headaches ?Endocrine: No unexpected weight changes ?Hematologic/Lymphatic:  no night sweats ? ?Exam ?BP (!) 142/88   Pulse (!) 47   Temp 98 ?F (36.7 ?C) (Oral)   Ht 5\' 10"  (1.778 m)   Wt 250 lb (113.4 kg)   SpO2 99%   BMI 35.87 kg/m?  ?General:  well developed, well nourished, in no apparent distress ?Skin:  no significant moles, warts, or growths ?Head:  no masses, lesions, or tenderness ?Eyes:  pupils equal and  round, sclera anicteric without injection ?Ears:  canals without lesions, TMs shiny without retraction, no obvious effusion, no erythema ?Nose:  nares patent, septum midline, mucosa normal ?Throat/Pharynx:  lips and gingiva without lesion; tongue and uvula midline; non-inflamed pharynx; no exudates or postnasal drainage ?Neck: neck supple without adenopathy, thyromegaly, or masses ?Lungs:  clear to auscultation, breath sounds equal bilaterally, no respiratory distress ?Cardio:  regular rate and rhythm, no bruits, no LE edema ?Abdomen:  abdomen soft, nontender; bowel sounds normal; no masses or organomegaly ?Rectal: Deferred ?Musculoskeletal:  symmetrical muscle groups noted without atrophy or deformity ?Extremities:  no clubbing, cyanosis, or edema, no deformities, no skin discoloration ?Neuro:  gait normal; deep tendon reflexes normal and symmetric ?Psych: well oriented with normal range of affect and appropriate judgment/insight ? ?Assessment and Plan ? ?Well adult exam - Plan: CBC, Comprehensive metabolic panel, Lipid panel ? ?Screening for prostate cancer - Plan: PSA ? ?Essential hypertension - Plan: amLODipine (NORVASC) 5 MG tablet ? ?Screen for colon cancer - Plan: Ambulatory referral to Gastroenterology  ? ?Well 46 y.o. male. ?Counseled on diet and exercise. ?Counseled on risks and benefits of prostate cancer screening with PSA. The patient agrees to undergo screening.  ?Advanced directive form provided today.  ?Other orders as above. ?Start referral process for setting up colonoscopy.  ?HTN: Chronic, uncontrolled. Start Norvasc 5 mg/d. Monitor BP at home. Reck in 1 mo.  ?The patient voiced understanding and agreement to the plan. ? ?  Jilda Roche Loyal, DO ?10/06/21 ?2:16 PM ? ?

## 2021-11-05 ENCOUNTER — Ambulatory Visit (INDEPENDENT_AMBULATORY_CARE_PROVIDER_SITE_OTHER): Payer: BC Managed Care – PPO | Admitting: Family Medicine

## 2021-11-05 ENCOUNTER — Encounter: Payer: Self-pay | Admitting: Family Medicine

## 2021-11-05 VITALS — BP 146/82 | HR 69 | Temp 98.0°F | Ht 70.0 in | Wt 255.5 lb

## 2021-11-05 DIAGNOSIS — Z0000 Encounter for general adult medical examination without abnormal findings: Secondary | ICD-10-CM | POA: Diagnosis not present

## 2021-11-05 DIAGNOSIS — I10 Essential (primary) hypertension: Secondary | ICD-10-CM | POA: Diagnosis not present

## 2021-11-05 DIAGNOSIS — Z125 Encounter for screening for malignant neoplasm of prostate: Secondary | ICD-10-CM | POA: Diagnosis not present

## 2021-11-05 LAB — COMPREHENSIVE METABOLIC PANEL
ALT: 32 U/L (ref 0–53)
AST: 20 U/L (ref 0–37)
Albumin: 4.4 g/dL (ref 3.5–5.2)
Alkaline Phosphatase: 90 U/L (ref 39–117)
BUN: 12 mg/dL (ref 6–23)
CO2: 31 mEq/L (ref 19–32)
Calcium: 9.2 mg/dL (ref 8.4–10.5)
Chloride: 102 mEq/L (ref 96–112)
Creatinine, Ser: 1.08 mg/dL (ref 0.40–1.50)
GFR: 83.2 mL/min (ref >60.00)
Glucose, Bld: 95 mg/dL (ref 70–99)
Potassium: 4.3 mEq/L (ref 3.5–5.1)
Sodium: 139 mEq/L (ref 135–145)
Total Bilirubin: 0.4 mg/dL (ref 0.2–1.2)
Total Protein: 7.1 g/dL (ref 6.0–8.3)

## 2021-11-05 LAB — LIPID PANEL
Cholesterol: 211 mg/dL — ABNORMAL HIGH (ref 0–200)
HDL: 41.1 mg/dL (ref >39.00)
LDL Cholesterol: 143 mg/dL — ABNORMAL HIGH (ref 0–99)
NonHDL: 169.76
Total CHOL/HDL Ratio: 5
Triglycerides: 133 mg/dL (ref 0.0–149.0)
VLDL: 26.6 mg/dL (ref 0.0–40.0)

## 2021-11-05 LAB — CBC
HCT: 42.8 % (ref 39.0–52.0)
Hemoglobin: 13.7 g/dL (ref 13.0–17.0)
MCHC: 31.9 g/dL (ref 30.0–36.0)
MCV: 65.5 fl — ABNORMAL LOW (ref 78.0–100.0)
Platelets: 250 10*3/uL (ref 150.0–400.0)
RBC: 6.53 Mil/uL — ABNORMAL HIGH (ref 4.22–5.81)
RDW: 14.9 % (ref 11.5–15.5)
WBC: 9.1 10*3/uL (ref 4.0–10.5)

## 2021-11-05 LAB — PSA: PSA: 0.68 ng/mL (ref 0.10–4.00)

## 2021-11-05 MED ORDER — AMLODIPINE BESYLATE 10 MG PO TABS: 10.0000 mg | ORAL_TABLET | Freq: Every day | ORAL | 3 refills | Status: DC

## 2021-11-05 NOTE — Progress Notes (Signed)
Chief Complaint  ?Patient presents with  ? Follow-up  ?  Do labs today ?Blood pressure medication  ? ? ?Subjective ?Tony Thompson is a 46 y.o. male who presents for hypertension follow up. ?He does monitor home blood pressures. ?Blood pressures ranging from 120-140's/80-90's on average. ?He is compliant with medication- Norvasc 5 mg/d. ?Patient has these side effects of medication: none ?He is sometimes adhering to a healthy diet overall. ?Current exercise: walking ?No Cp or SOB.  ?  ?Past Medical History:  ?Diagnosis Date  ? No known health problems   ? ? ?Exam ?BP (!) 146/82 (BP Location: Left Arm, Cuff Size: Large)   Pulse 69   Temp 98 ?F (36.7 ?C) (Oral)   Ht 5\' 10"  (1.778 m)   Wt 255 lb 8 oz (115.9 kg)   SpO2 99%   BMI 36.66 kg/m?  ?General:  well developed, well nourished, in no apparent distress ?Heart: RRR, no bruits, no LE edema ?Lungs: clear to auscultation, no accessory muscle use ?Psych: well oriented with normal range of affect and appropriate judgment/insight ? ?Essential hypertension ? ?Screening for prostate cancer - Plan: PSA ? ?Well adult exam - Plan: CBC, Comprehensive metabolic panel, Lipid panel ? ?Chronic, unstable. Increase Norvasc from 5 mg/d to 10 mg/d. Counseled on diet and exercise which he plans on improving. Cont to monitor BP at home.  ?F/u in 6 weeks. ?The patient voiced understanding and agreement to the plan. ? ?Shelda Pal, DO ?11/05/21  ?8:16 AM ? ?

## 2021-11-05 NOTE — Patient Instructions (Signed)
Give [place] 2-3 business days to get the results of your labs back.  ? ?Keep the diet clean and stay active. ? ?Aim to do some physical exertion for 150 minutes per week. This is typically divided into 5 days per week, 30 minutes per day. The activity should be enough to get your heart rate up. Anything is better than nothing if you have time constraints. ? ?Keep checking your blood pressure at home.  ? ?Let us know if you need anything. ?

## 2021-12-09 DIAGNOSIS — J020 Streptococcal pharyngitis: Secondary | ICD-10-CM | POA: Diagnosis not present

## 2021-12-17 ENCOUNTER — Ambulatory Visit: Payer: BC Managed Care – PPO | Admitting: Family Medicine

## 2022-04-25 ENCOUNTER — Encounter: Payer: Self-pay | Admitting: Family Medicine

## 2022-05-23 DIAGNOSIS — M5413 Radiculopathy, cervicothoracic region: Secondary | ICD-10-CM | POA: Diagnosis not present

## 2022-05-23 DIAGNOSIS — M5417 Radiculopathy, lumbosacral region: Secondary | ICD-10-CM | POA: Diagnosis not present

## 2022-05-23 DIAGNOSIS — M9903 Segmental and somatic dysfunction of lumbar region: Secondary | ICD-10-CM | POA: Diagnosis not present

## 2022-05-23 DIAGNOSIS — M9901 Segmental and somatic dysfunction of cervical region: Secondary | ICD-10-CM | POA: Diagnosis not present

## 2022-07-15 DIAGNOSIS — L03012 Cellulitis of left finger: Secondary | ICD-10-CM | POA: Diagnosis not present

## 2022-07-15 DIAGNOSIS — R030 Elevated blood-pressure reading, without diagnosis of hypertension: Secondary | ICD-10-CM | POA: Diagnosis not present

## 2022-07-31 ENCOUNTER — Emergency Department (HOSPITAL_BASED_OUTPATIENT_CLINIC_OR_DEPARTMENT_OTHER): Payer: BC Managed Care – PPO

## 2022-07-31 ENCOUNTER — Encounter (HOSPITAL_BASED_OUTPATIENT_CLINIC_OR_DEPARTMENT_OTHER): Payer: Self-pay | Admitting: Emergency Medicine

## 2022-07-31 ENCOUNTER — Other Ambulatory Visit: Payer: Self-pay

## 2022-07-31 ENCOUNTER — Emergency Department (HOSPITAL_BASED_OUTPATIENT_CLINIC_OR_DEPARTMENT_OTHER)
Admission: EM | Admit: 2022-07-31 | Discharge: 2022-07-31 | Disposition: A | Discharge status: Home / Self Care | Payer: BC Managed Care – PPO | Attending: Emergency Medicine | Admitting: Emergency Medicine

## 2022-07-31 DIAGNOSIS — L03012 Cellulitis of left finger: Secondary | ICD-10-CM | POA: Insufficient documentation

## 2022-07-31 DIAGNOSIS — M79645 Pain in left finger(s): Secondary | ICD-10-CM | POA: Diagnosis not present

## 2022-07-31 MED ORDER — CEPHALEXIN 500 MG PO CAPS: 500.0000 mg | ORAL_CAPSULE | Freq: Four times a day (QID) | ORAL | 0 refills | Status: DC

## 2022-07-31 MED ORDER — LIDOCAINE HCL (PF) 1 % IJ SOLN
10.0000 mL | Freq: Once | INTRAMUSCULAR | Status: AC
  Administered 2022-07-31: 10 mL
  Filled 2022-07-31: qty 10

## 2022-07-31 MED ORDER — DOXYCYCLINE HYCLATE 100 MG PO CAPS: 100.0000 mg | ORAL_CAPSULE | Freq: Two times a day (BID) | ORAL | 0 refills | Status: DC

## 2022-07-31 NOTE — ED Provider Notes (Signed)
[facility] Provider Note   CSN: [id number] Arrival date & time: 07/31/22  1059     History  Chief Complaint  Patient presents with   Wound Check    Tony Thompson is a 47 y.o. male, no pertinent past medical history, who presents to the ED secondary to swelling of his left index finger, after an incision and drainage, done on 1/12 by urgent care.  He states that he had redness, swelling of his finger that occurred about 2 weeks ago, was seen in urgent care, the area was lanced, and he was placed on Bactrim, for about 7 days.  Was told to only soak it for about a day, and that it would resolve.  He states that he did this, and that a couple days ago it started coming back.  States that he does not have any fevers or chills, but redness, swelling, and a whitehead on his left index finger.     Home Medications Prior to Admission medications   Medication Sig Start Date End Date Taking? Authorizing Provider  cephALEXin (KEFLEX) 500 MG capsule Take 1 capsule (500 mg total) by mouth 4 (four) times daily. 07/31/22  Yes Tony Arkin L, PA  doxycycline (VIBRAMYCIN) 100 MG capsule Take 1 capsule (100 mg total) by mouth 2 (two) times daily. 07/31/22  Yes Tony Piasecki L, PA  amLODipine (NORVASC) 10 MG tablet Take 1 tablet (10 mg total) by mouth daily. 11/05/21   Tony Pal, DO      Allergies    Patient has no known allergies.    Review of Systems   Review of Systems  Skin:  Positive for wound. Negative for color change.    Physical Exam Updated Vital Signs BP (!) 156/108 (BP Location: Right Arm)   Pulse 83   Temp 98.4 F (36.9 C) (Oral)   Resp 17   Ht 5\' 10"  (1.778 m)   Wt 115.7 kg   SpO2 98%   BMI 36.59 kg/m  Physical Exam Vitals and nursing note reviewed.  Constitutional:      General: He is not in acute distress.    Appearance: He is well-developed.  HENT:     Head: Normocephalic and atraumatic.  Eyes:      General:        Right eye: No discharge.        Left eye: No discharge.     Conjunctiva/sclera: Conjunctivae normal.  Cardiovascular:     Pulses: Normal pulses.  Pulmonary:     Effort: No respiratory distress.  Skin:    Comments: Welling, along the DIP of the index finger, with a central punctate pustular lesion on the border of the index finger.  Neurological:     Mental Status: He is alert.     Comments: Clear speech.   Psychiatric:        Behavior: Behavior normal.        Thought Content: Thought content normal.     ED Results / Procedures / Treatments   Labs (all labs ordered are listed, but only abnormal results are displayed) Labs Reviewed - No data to display  EKG None  Radiology DG Finger Index Left  Result Date: 07/31/2022 CLINICAL DATA:  Pain.  Previous incision and drainage EXAM: LEFT INDEX FINGER 2+V COMPARISON:  None Available. FINDINGS: There is no evidence of fracture or dislocation. No erosion or periosteal elevation. No focal soft tissue swelling or soft tissue gas. IMPRESSION: No acute  osseous abnormality of the left index finger. Electronically Signed   By: Tony Thompson D.O.   On: 07/31/2022 13:08    Procedures .Marland KitchenIncision and Drainage  Date/Time: 07/31/2022 2:19 PM  Performed by: Tony Shipper, PA Authorized by: Tony Shipper, PA   Consent:    Consent obtained:  Verbal   Consent given by:  Patient   Risks, benefits, and alternatives were discussed: yes     Risks discussed:  Bleeding, incomplete drainage, pain, infection and damage to other organs   Alternatives discussed:  Alternative treatment Universal protocol:    Patient identity confirmed:  Verbally with patient Location:    Indications for incision and drainage: paronychia w/abscess.   Size:  1cm   Location: Thompson index finger. Pre-procedure details:    Skin preparation:  Povidone-iodine Sedation:    Sedation type:  None Anesthesia:    Anesthesia method:  Nerve block   Block needle  gauge:  27 G   Block anesthetic:  Lidocaine 1% w/o epi   Block technique:  Digital   Block outcome:  Anesthesia achieved Procedure type:    Complexity:  Simple Procedure details:    Incision types:  Stab incision   Wound management:  Extensive cleaning   Drainage:  Bloody and purulent   Drainage amount:  Moderate   Wound treatment:  Wound left open   Packing materials:  None Post-procedure details:    Procedure completion:  Tolerated     Medications Ordered in ED Medications  lidocaine (PF) (XYLOCAINE) 1 % injection 10 mL (10 mLs Other Given by Other 07/31/22 1329)    ED Course/ Medical Decision Making/ A&P                             Medical Decision Making Patient is a 47 year old male, here for a abscess on his finger, states he is completed course of Bactrim, and still had to come back.  Denies history of nail biting.  States that he had a hair stuck under his fingernail, which she thinks that is what caused it.  On exam he has paronychia, with abscess, it will require I&D.  X-ray ordered by nursing unremarkable.  Lanced area including under the cuticle and a Tony Thompson incision to the top of the finger.  With moderate purulent discharge.  Relief of pain.  Prescribed Keflex, doxycycline to help with the healing, discussed importance of Epson salts soaks, and follow-up with PCP.  Amount and/or Complexity of Data Reviewed Radiology: ordered.    Details: Unremarkable  Risk Prescription drug management.    Final Clinical Impression(s) / ED Diagnoses Final diagnoses:  Paronychia of left index finger    Rx / DC Orders ED Discharge Orders          Ordered    doxycycline (VIBRAMYCIN) 100 MG capsule  2 times daily        07/31/22 1417    cephALEXin (KEFLEX) 500 MG capsule  4 times daily        07/31/22 1417              Tony Thompson, [place], [place] 07/31/22 1421    Tony Congo, MD 07/31/22 [phone]

## 2022-07-31 NOTE — ED Triage Notes (Signed)
Pt with pain to LT index finger; was seen at [facility] 1/12, had I& D, abx; pain and swelling returned Fri

## 2022-07-31 NOTE — Discharge Instructions (Addendum)
Please follow-up with your primary care doctor, and complete antibiotics as prescribed.  If you develop redness, swelling please return to the ER.  Take the antibiotics as prescribed you should do warm Epsom salt soaks of your finger 10 to 15 minutes several times a day.

## 2022-07-31 NOTE — ED Notes (Signed)
I&D tray at bedside.

## 2022-10-31 IMAGING — DX DG FOOT COMPLETE 3+V*R*
3 series · 3 of 3 positions shown · non-contrast
Comparison: None.

CLINICAL DATA: Dumbbell fell on foot

EXAM:
RIGHT FOOT COMPLETE - 3+ VIEW

[foot ap]
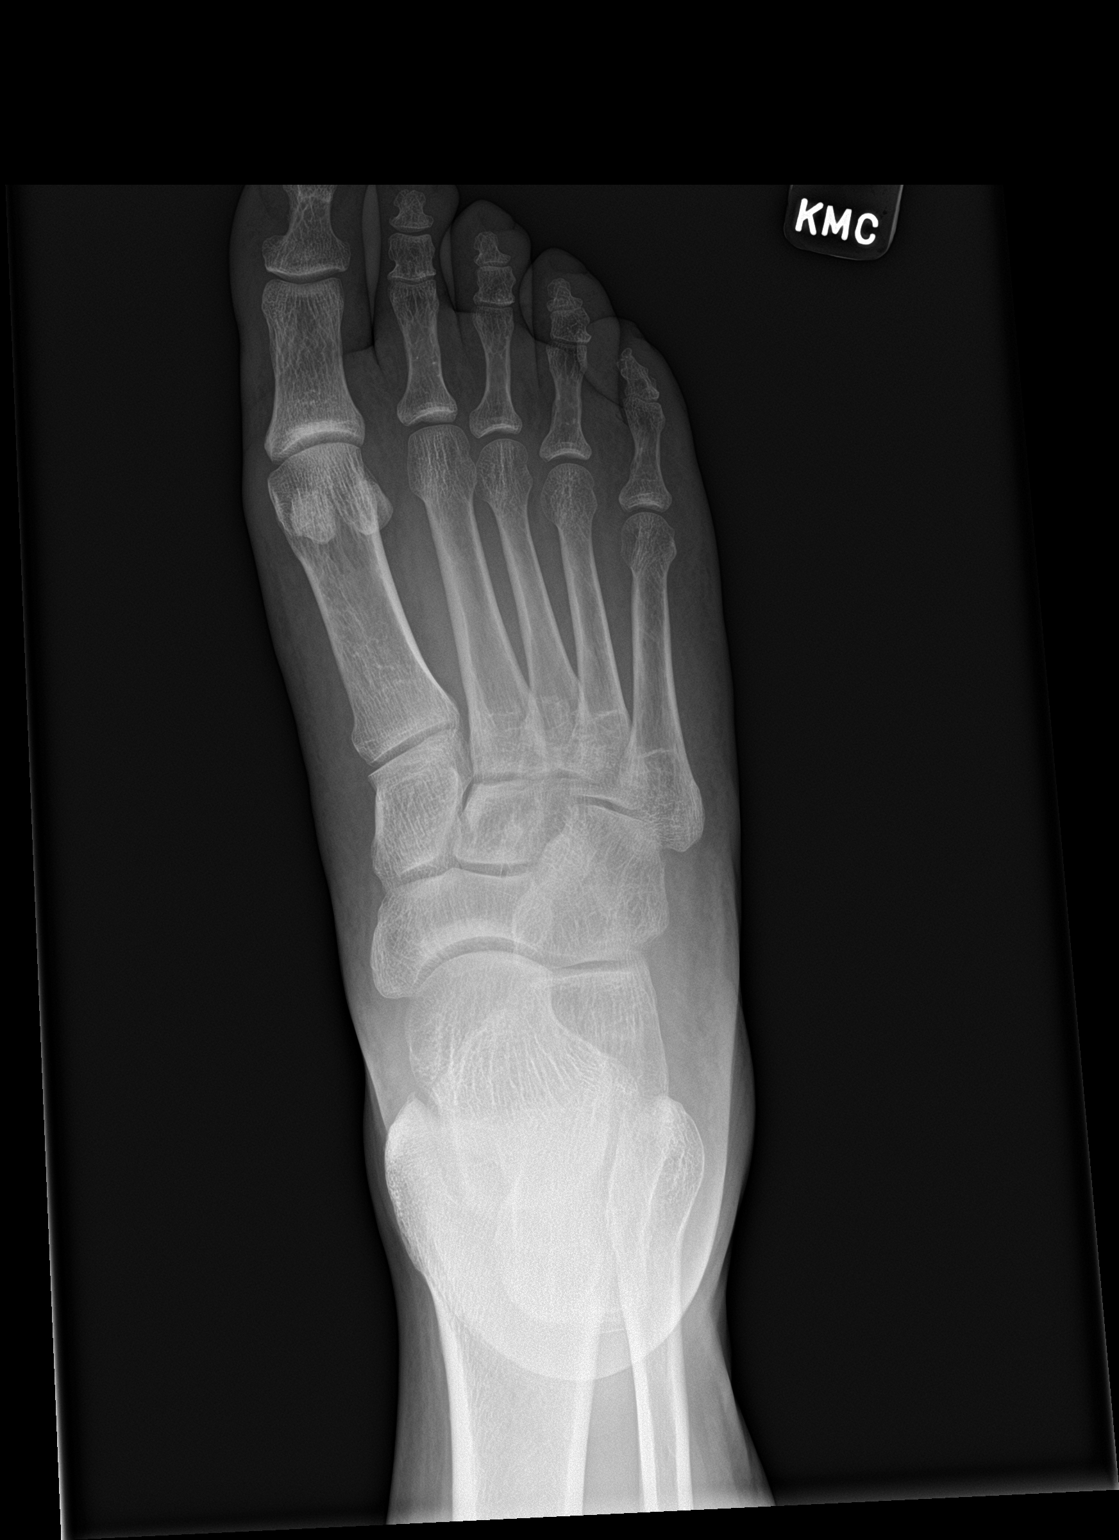

[foot obl]
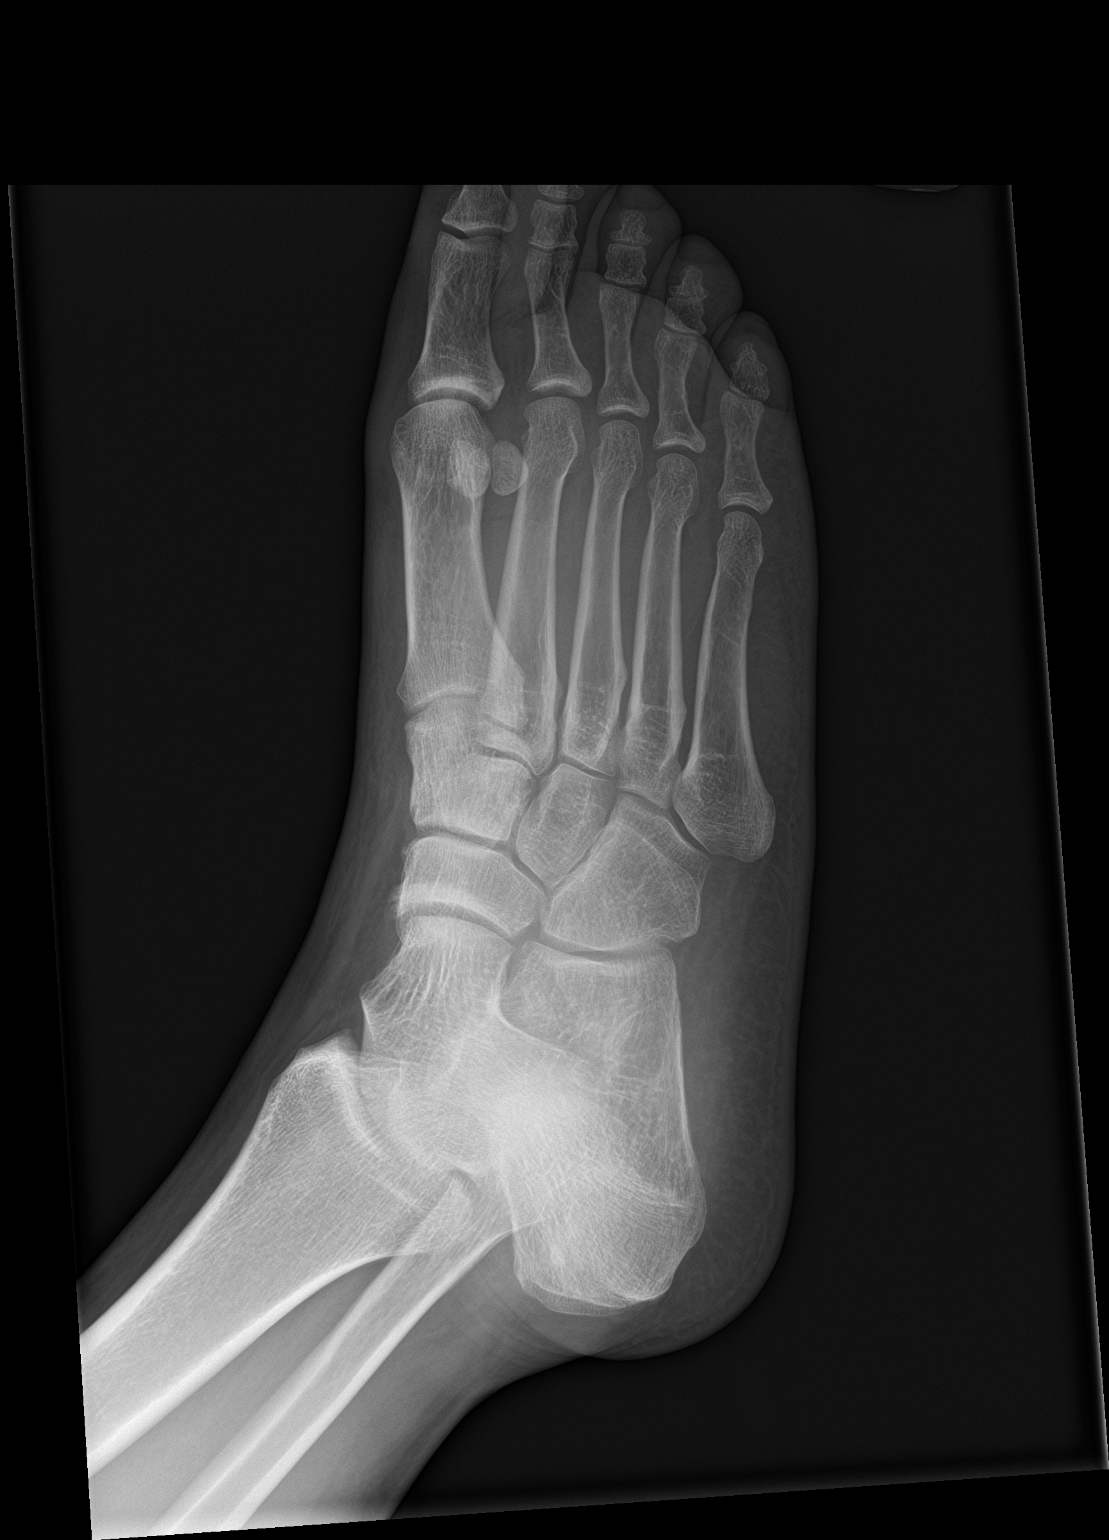

[foot lat]
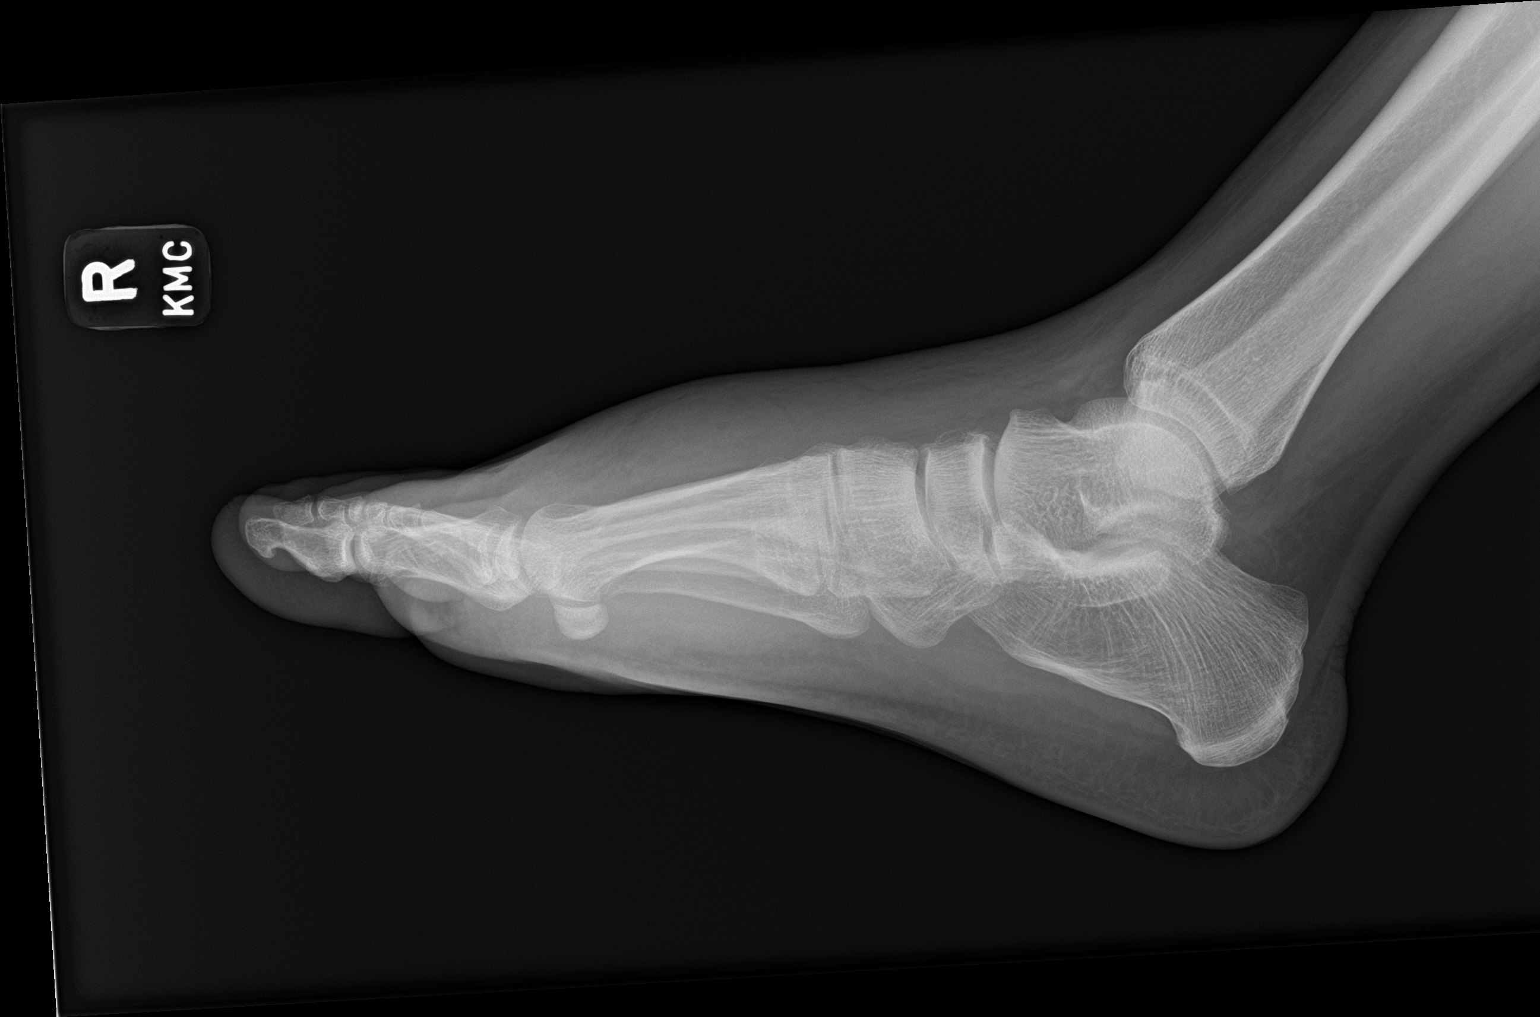

[3 of 3 positions shown; findings below may reference images not displayed]

FINDINGS: There is no acute fracture or dislocation. Alignment is normal. The
Lisfranc and Chopart joints are intact. The joint spaces are
preserved.

There is marked soft tissue swelling over the dorsum of the foot.
IMPRESSION: Marked soft tissue swelling over the dorsum of the foot without
underlying fracture or dislocation.

## 2022-12-06 ENCOUNTER — Telehealth: Payer: Self-pay

## 2022-12-06 NOTE — Telephone Encounter (Signed)
Initial Comment Has abdominal and back discomfort, in the morning only his urine is maroonish color. Translation No Nurse Assessment Nurse: Edson Snowball, RN, Alice Date/Time (Eastern Time): 12/06/2022 3:11:35 PM Confirm and document reason for call. If symptomatic, describe symptoms. ---Has abdominal and back discomfort, almost as if he pulled a muscle, that started on 12/03/2022. On 11/25/2022 when he eats a few bites of food he feels very full then sharp pain in the stomach, had a BM and felt relief. The next morning and the urine was maroon, has been having this ever since 05/24. Has been having that fullness of the stomach, has had intermittent discomfort in the lower back on the left side. Does the patient have any new or worsening symptoms? ---Yes Will a triage be completed? ---Yes Related visit to physician within the last 2 weeks? ---No Does the PT have any chronic conditions? (i.e. diabetes, asthma, this includes High risk factors for pregnancy, etc.) ---No Is this a behavioral health or substance abuse call? ---No Guidelines Guideline Title Affirmed Question Affirmed Notes Nurse Date/Time (Eastern Time) Urine - Blood In Side (flank) or back pain present Jolyne Loa 12/06/2022 3:19:30 PM Disp. Time Lamount Cohen Time) Disposition Final User 12/06/2022 2:57:50 PM Attempt made - message left Edson Snowball, RN, Alice PLEASE NOTE: All timestamps contained within this report are represented as [place] Standard Time. CONFIDENTIALTY NOTICE: This fax transmission is intended only for the addressee. It contains information that is legally privileged, confidential or otherwise protected from use or disclosure. If you are not the intended recipient, you are strictly prohibited from reviewing, disclosing, copying using or disseminating any of this information or taking any action in reliance on or regarding this information. If you have received this fax in error, please notify us immediately by  telephone so that we can arrange for its return to [place]. Phone: [phone], Toll-Free: [phone], Fax: [phone] Page: 2 of 2 Call Id: [id number] Disp. Time Lamount Cohen Time) Disposition Final User 12/06/2022 3:22:57 PM See PCP within 24 Hours Yes Edson Snowball, RN, Alice Final Disposition 12/06/2022 3:22:57 PM See PCP within 24 Hours Yes Edson Snowball, RN, Jannett Celestine Disagree/Comply Comply Caller Understands Yes PreDisposition Call Doctor Care Advice Given Per Guideline SEE PCP WITHIN 24 HOURS: * IF OFFICE WILL BE OPEN: You need to be examined within the next 24 hours. Call your doctor (or NP/PA) when the office opens and make an appointment. * For pain relief, you can take either acetaminophen, ibuprofen, or naproxen. * ACETAMINOPHEN - REGULAR STRENGTH TYLENOL: Take 650 mg (two 325 mg pills) by mouth every 4 to 6 hours as needed. Each Regular Strength Tylenol pill has 325 mg of acetaminophen. The most you should take is 10 pills a day (3,250 mg total). Note: In [place], the maximum is 12 pills a day (3,900 mg total). CALL BACK IF: * Fever occurs * You become worse CARE ADVICE given per Urine, Blood In (Adult) guideline. Referrals REFERRED TO PCP OFFICE

## 2022-12-07 ENCOUNTER — Ambulatory Visit (INDEPENDENT_AMBULATORY_CARE_PROVIDER_SITE_OTHER): Payer: BC Managed Care – PPO | Admitting: Family Medicine

## 2022-12-07 ENCOUNTER — Encounter: Payer: Self-pay | Admitting: Family Medicine

## 2022-12-07 VITALS — BP 146/98 | HR 86 | Temp 99.3°F | Ht 70.0 in | Wt 257.4 lb

## 2022-12-07 DIAGNOSIS — Z23 Encounter for immunization: Secondary | ICD-10-CM

## 2022-12-07 DIAGNOSIS — R1032 Left lower quadrant pain: Secondary | ICD-10-CM

## 2022-12-07 DIAGNOSIS — Z1211 Encounter for screening for malignant neoplasm of colon: Secondary | ICD-10-CM

## 2022-12-07 DIAGNOSIS — M549 Dorsalgia, unspecified: Secondary | ICD-10-CM

## 2022-12-07 DIAGNOSIS — I10 Essential (primary) hypertension: Secondary | ICD-10-CM | POA: Diagnosis not present

## 2022-12-07 MED ORDER — AMLODIPINE BESYLATE 5 MG PO TABS
5.0000 mg | ORAL_TABLET | Freq: Every day | ORAL | 3 refills | Status: DC
Start: 2022-12-07 — End: 2023-04-10

## 2022-12-07 NOTE — Patient Instructions (Signed)
We will be in touch with your -ray results.   Please get your X-ray done in the basement of our [place] office located on: [address] [place], [place] [address]  You do not need an appointment for that location.   Send me a message in 2 weeks with some blood pressure readings.  If you do not hear anything about your referral in the next 1-2 weeks, call our office and ask for an update.  Let us know if you need anything.

## 2022-12-07 NOTE — Progress Notes (Signed)
Chief Complaint  Patient presents with   Back Pain   Acute Visit    Stomach discomfort off and on. Every morning, but today urine has been discolored Today urine color was fine. Take BP medication every other day due to being light headed.    Tony Thompson is here for abdominal pain.  Duration: 4 days Nighttime awakenings? No Bleeding? No Weight loss? No Palliation: time, standing up Provocation: Eating Associated symptoms: nausea Denies: fever, vomiting, and diarrhea, fevers Treatment to date: none  Hypertension Patient presents for hypertension follow up. He does monitor home blood pressures. He is not compliant with medication-been taking amlodipine 10 mg every other day due to lightheadedness. Patient has these side effects of medication: Lightheadedness He is adhering to a healthy diet overall. Exercise: walking, lifting wts No CP or SOB.   Past Medical History:  Diagnosis Date   No known health problems     BP (!) 146/98 (BP Location: Left Arm, Cuff Size: Large)   Pulse 86   Temp 99.3 F (37.4 C) (Oral)   Ht 5\' 10"  (1.778 m)   Wt 257 lb 6 oz (116.7 kg)   SpO2 96%   BMI 36.93 kg/m  Gen.: Awake, alert, appears stated age HEENT: Mucous membranes moist without mucosal lesions Heart: Regular rate and rhythm without murmurs Lungs: Clear auscultation bilaterally, no rales or wheezing, normal effort without accessory muscle use. Abdomen: Bowel sounds are present. Abdomen is soft, TTP in the left lower quadrant, nondistended, no masses or organomegaly. Negative Murphy's, Rovsing's, McBurney's, and Carnett's sign. Psych: Age appropriate judgment and insight. Normal mood and affect.  Left lower quadrant abdominal pain - Plan: DG Abd 2 Views  Back pain, unspecified back location, unspecified back pain laterality, unspecified chronicity - Plan: Urine Microscopic Only  Screen for colon cancer - Plan: Ambulatory referral to Gastroenterology  Essential  hypertension  Check x-ray of the abdomen.  Suspect constipation/gas, he be a bit young for diverticulitis but he does need a colonoscopy.  Abdomen is nonacute. Check urine microscopy.  Probably referred pain from the abdomen which has happened to her before. Colon cancer screening as above. Adverse effect of chronic medication.  Will decrease his amlodipine to 5 mg but encouraged he takes it daily.  Monitor blood pressure at home and send me some readings on MyChart in 2 weeks.  Counseled on diet and exercise.  Follow-up in 1 month to recheck blood pressure. Pt voiced understanding and agreement to the plan.  Jilda Roche Ballou, DO 12/07/22 2:48 PM

## 2022-12-08 LAB — URINALYSIS, MICROSCOPIC ONLY

## 2022-12-09 ENCOUNTER — Ambulatory Visit (INDEPENDENT_AMBULATORY_CARE_PROVIDER_SITE_OTHER)
Admission: RE | Admit: 2022-12-09 | Discharge: 2022-12-09 | Disposition: A | Discharge status: Home / Self Care | Payer: BC Managed Care – PPO | Source: Ambulatory Visit | Attending: Family Medicine | Admitting: Family Medicine

## 2022-12-09 ENCOUNTER — Other Ambulatory Visit: Payer: BC Managed Care – PPO

## 2022-12-09 DIAGNOSIS — R1032 Left lower quadrant pain: Secondary | ICD-10-CM

## 2022-12-09 DIAGNOSIS — R109 Unspecified abdominal pain: Secondary | ICD-10-CM | POA: Diagnosis not present

## 2023-01-02 ENCOUNTER — Encounter: Payer: BC Managed Care – PPO | Admitting: Family Medicine

## 2023-02-10 ENCOUNTER — Encounter: Payer: BC Managed Care – PPO | Admitting: Family Medicine

## 2023-02-16 ENCOUNTER — Encounter (INDEPENDENT_AMBULATORY_CARE_PROVIDER_SITE_OTHER): Payer: Self-pay

## 2023-03-10 ENCOUNTER — Encounter: Payer: Self-pay | Admitting: Pharmacist

## 2023-03-14 ENCOUNTER — Encounter: Payer: Self-pay | Admitting: Family Medicine

## 2023-03-14 ENCOUNTER — Ambulatory Visit (INDEPENDENT_AMBULATORY_CARE_PROVIDER_SITE_OTHER): Payer: BC Managed Care – PPO | Admitting: Family Medicine

## 2023-03-14 VITALS — BP 144/86 | HR 70 | Temp 98.0°F | Ht 70.0 in | Wt 254.4 lb

## 2023-03-14 DIAGNOSIS — I10 Essential (primary) hypertension: Secondary | ICD-10-CM | POA: Diagnosis not present

## 2023-03-14 DIAGNOSIS — Z0000 Encounter for general adult medical examination without abnormal findings: Secondary | ICD-10-CM | POA: Diagnosis not present

## 2023-03-14 DIAGNOSIS — Z125 Encounter for screening for malignant neoplasm of prostate: Secondary | ICD-10-CM | POA: Diagnosis not present

## 2023-03-14 NOTE — Progress Notes (Signed)
Chief Complaint  Patient presents with   Annual Exam    Well Male Tony Thompson is here for a complete physical.   His last physical was >1 year ago.  Current diet: in general, diet is fair.   Current exercise: walking, playing basketball Weight trend: stable Fatigue out of ordinary? No. Seat belt? Yes.   Advanced directive? No  Health maintenance Tetanus- Yes CCS- due HIV- Yes Hep C- Yes  Past Medical History:  Diagnosis Date   No known health problems      Past Surgical History:  Procedure Laterality Date   NO PAST SURGERIES      Medications  Current Outpatient Medications on File Prior to Visit  Medication Sig Dispense Refill   amLODipine (NORVASC) 5 MG tablet Take 1 tablet (5 mg total) by mouth daily. 30 tablet 3   Allergies No Known Allergies  Family History Family History  Problem Relation Age of Onset   Cancer Neg Hx    Heart disease Neg Hx     Review of Systems: Constitutional: no fevers or chills Eye:  no recent significant change in vision Ear/Nose/Mouth/Throat:  Ears:  no hearing loss Nose/Mouth/Throat:  no complaints of nasal congestion, no sore throat Cardiovascular:  no chest pain Respiratory:  no shortness of breath Gastrointestinal:  no abdominal pain, no change in bowel habits GU:  Male: negative for dysuria, frequency, and incontinence Musculoskeletal/Extremities:  no pain of the joints Integumentary (Skin/Breast):  no abnormal skin lesions reported Neurologic:  no headaches Endocrine: No unexpected weight changes Hematologic/Lymphatic:  no night sweats  Exam BP (!) 144/86 (BP Location: Left Arm, Cuff Size: Large)   Pulse 70   Temp 98 F (36.7 C) (Oral)   Ht 5\' 10"  (1.778 m)   Wt 254 lb 6 oz (115.4 kg)   SpO2 99%   BMI 36.50 kg/m  General:  well developed, well nourished, in no apparent distress Skin:  no significant moles, warts, or growths Head:  no masses, lesions, or tenderness Eyes:  pupils equal and round, sclera  anicteric without injection Ears:  canals without lesions, TMs shiny without retraction, no obvious effusion, no erythema Nose:  nares patent, mucosa normal Throat/Pharynx:  lips and gingiva without lesion; tongue and uvula midline; non-inflamed pharynx; no exudates or postnasal drainage Neck: neck supple without adenopathy, thyromegaly, or masses Lungs:  clear to auscultation, breath sounds equal bilaterally, no respiratory distress Cardio:  regular rate and rhythm, no bruits, no LE edema Abdomen:  abdomen soft, nontender; bowel sounds normal; no masses or organomegaly Rectal: Deferred Musculoskeletal:  symmetrical muscle groups noted without atrophy or deformity Extremities:  no clubbing, cyanosis, or edema, no deformities, no skin discoloration Neuro:  gait normal; deep tendon reflexes normal and symmetric Psych: well oriented with normal range of affect and appropriate judgment/insight  Assessment and Plan  Well adult exam - Plan: CBC, Comprehensive metabolic panel, Lipid panel  Screening for prostate cancer - Plan: PSA  Essential hypertension   Well 47 y.o. male. Counseled on diet and exercise. Counseled on risks and benefits of prostate cancer screening with PSA. The patient agrees to undergo screening.  Advanced directive form provided today.  LBGI info provided.  HTN: Monitor BP at home, cont Norvasc 5 mg/d. Discussed increasing but he would like to focus on lifestyle mods. Reck in 2 mo.  Other orders as above. The patient voiced understanding and agreement to the plan.  Jilda Roche [place]illa, DO 03/14/23 2:00 PM

## 2023-03-14 NOTE — Patient Instructions (Addendum)
Give [place] 2-3 business days to get the results of your labs back.   Keep the diet clean and stay active.  Please get me a copy of your advanced directive form at your convenience.   Check your blood pressures 2-3 times per week, alternating the time of day you check it. If it is high, considering waiting 1-2 minutes and rechecking. If it gets higher, your anxiety is likely creeping up and we should avoid rechecking.   Please contact the GI team at: [phone]  I recommend getting the flu shot in mid October. This suggestion would change if the CDC comes out with a different recommendation.   Let us know if you need anything.

## 2023-03-15 LAB — LIPID PANEL
Cholesterol: 211 mg/dL — ABNORMAL HIGH (ref 0–200)
HDL: 38.1 mg/dL — ABNORMAL LOW (ref >39.00)
LDL Cholesterol: 128 mg/dL — ABNORMAL HIGH (ref 0–99)
NonHDL: 172.62
Total CHOL/HDL Ratio: 6
Triglycerides: 224 mg/dL — ABNORMAL HIGH (ref 0.0–149.0)
VLDL: 44.8 mg/dL — ABNORMAL HIGH (ref 0.0–40.0)

## 2023-03-15 LAB — COMPREHENSIVE METABOLIC PANEL
ALT: 22 U/L (ref 0–53)
AST: 21 U/L (ref 0–37)
Albumin: 4.4 g/dL (ref 3.5–5.2)
Alkaline Phosphatase: 84 U/L (ref 39–117)
BUN: 15 mg/dL (ref 6–23)
CO2: 32 meq/L (ref 19–32)
Calcium: 9.8 mg/dL (ref 8.4–10.5)
Chloride: 102 meq/L (ref 96–112)
Creatinine, Ser: 1.16 mg/dL (ref 0.40–1.50)
GFR: 75.64 mL/min (ref >60.00)
Glucose, Bld: 96 mg/dL (ref 70–99)
Potassium: 4.2 meq/L (ref 3.5–5.1)
Sodium: 141 meq/L (ref 135–145)
Total Bilirubin: 0.5 mg/dL (ref 0.2–1.2)
Total Protein: 7.3 g/dL (ref 6.0–8.3)

## 2023-03-15 LAB — CBC
HCT: 42.9 % (ref 39.0–52.0)
Hemoglobin: 13.4 g/dL (ref 13.0–17.0)
MCHC: 31.3 g/dL (ref 30.0–36.0)
MCV: 66.2 fl — ABNORMAL LOW (ref 78.0–100.0)
Platelets: 290 10*3/uL (ref 150.0–400.0)
RBC: 6.48 Mil/uL — ABNORMAL HIGH (ref 4.22–5.81)
RDW: 15.3 % (ref 11.5–15.5)
WBC: 10.5 10*3/uL (ref 4.0–10.5)

## 2023-03-15 LAB — PSA: PSA: 0.88 ng/mL (ref 0.10–4.00)

## 2023-04-09 ENCOUNTER — Other Ambulatory Visit: Payer: Self-pay | Admitting: Family Medicine

## 2023-05-16 ENCOUNTER — Ambulatory Visit: Payer: BC Managed Care – PPO | Admitting: Family Medicine

## 2023-08-09 ENCOUNTER — Ambulatory Visit: Payer: Self-pay | Admitting: Family Medicine

## 2023-08-09 NOTE — Telephone Encounter (Signed)
 Chief Complaint: SOB Symptoms: wheezing  Frequency: comes and goes   Disposition: [] ED /[] Urgent Care (no appt availability in office) / [x] Appointment(In office/virtual)/ []  [facility]/ [] Home Care/ [] Refused Recommended Disposition /[] [facility]/ []  Follow-up with PCP Additional Notes: Pt complaining of mild SOB after talking. Pt tested positive for flu on 1/22. Pt has gone  to urgent care several times over past couple of weeks. Pt had xray that showed some fluid in lungs. Pt was prescribed an inhaler on 2/1 to help. Pt states he uses when he hears the popcorn sound in his chest, but states it dries his mouth out.  Pt denies gasping for air and fever. Pt states after talking he will get a burning sensation in chest. Pt described the burning feeling as he has been coughing a lot. Per protocol, pt to be seen within 3 days. Pt has appt 2/6 at 1500. RN gave care advice and pt verbalized understanding.         Copied from CRM [id number]. Topic: Appointments - Appointment Scheduling >> Aug 09, 2023  9:55 AM Victoria A wrote: Patient went to Urgent Care 1/22 diagnosed with Flu he went back prescribed inhaler; patient is having shortness of breath and burning sensation on left side of his chest Reason for Disposition  [1] MODERATE longstanding difficulty breathing (e.g., speaks in phrases, SOB even at rest, pulse 100-120) AND [2] SAME as normal  Answer Assessment - Initial Assessment Questions 1. RESPIRATORY STATUS: Describe your breathing? (e.g., wheezing, shortness of breath, unable to speak, severe coughing)      Wheezing popcorn sound  2. ONSET: When did this breathing problem begin?      1/26 3. PATTERN Does the difficult breathing come and go, or has it been constant since it started?      Comes and goes  4. SEVERITY: How bad is your breathing? (e.g., mild, moderate, severe)    - MILD: No SOB at rest, mild SOB with walking, speaks normally in sentences,  can lie down, no retractions, pulse < 100.    - MODERATE: SOB at rest, SOB with minimal exertion and prefers to sit, cannot lie down flat, speaks in phrases, mild retractions, audible wheezing, pulse 100-120.    - SEVERE: Very SOB at rest, speaks in single words, struggling to breathe, sitting hunched forward, retractions, pulse > 120      Mild  5. RECURRENT SYMPTOM: Have you had difficulty breathing before? If Yes, ask: When was the last time? and What happened that time?      no 6. CARDIAC HISTORY: Do you have any history of heart disease? (e.g., heart attack, angina, bypass surgery, angioplasty)      No  7. LUNG HISTORY: Do you have any history of lung disease?  (e.g., pulmonary embolus, asthma, emphysema)     Denies  8. CAUSE: What do you think is causing the breathing problem?      Unsure  9. OTHER SYMPTOMS: Do you have any other symptoms? (e.g., dizziness, runny nose, cough, chest pain, fever)     Cough 10. O2 SATURATION MONITOR:  Do you use an oxygen saturation monitor (pulse oximeter) at home? If Yes, ask: What is your reading (oxygen level) today? What is your usual oxygen saturation reading? (e.g., 95%)       Na   12. TRAVEL: Have you traveled out of the country in the last month? (e.g., travel history, exposures)       no  Protocols used:  Breathing Difficulty-A-AH

## 2023-08-10 ENCOUNTER — Ambulatory Visit (INDEPENDENT_AMBULATORY_CARE_PROVIDER_SITE_OTHER): Payer: 59 | Admitting: Family Medicine

## 2023-08-10 ENCOUNTER — Encounter: Payer: Self-pay | Admitting: Family Medicine

## 2023-08-10 VITALS — BP 160/110 | HR 88 | Temp 98.5°F | Resp 18 | Ht 70.0 in | Wt 251.2 lb

## 2023-08-10 DIAGNOSIS — J40 Bronchitis, not specified as acute or chronic: Secondary | ICD-10-CM

## 2023-08-10 MED ORDER — AZITHROMYCIN 250 MG PO TABS: ORAL_TABLET | ORAL | 0 refills | Status: DC

## 2023-08-10 MED ORDER — PREDNISONE 10 MG PO TABS: ORAL_TABLET | ORAL | 0 refills | Status: DC

## 2023-08-10 NOTE — Progress Notes (Signed)
 Established Patient Office Visit  Subjective   Patient ID: Tony Thompson, male    DOB: 1976-01-26  Age: 48 y.o. MRN: [id number]  Chief Complaint  Patient presents with   Wheezing    Pt states having the flu x2 weeks ago, pt states having wheezing and chest burning.    HPI Discussed the use of AI scribe software for clinical note transcription with the patient, who gave verbal consent to proceed.  History of Present Illness   Tony Thompson is a 48 year old male who presents with persistent wheezing and chest burning sensation.  He has been experiencing persistent wheezing and a burning sensation in his chest, which he describes as feeling like he has been coughing extensively, although he has not. He coughs occasionally, but the burning sensation is more prominent. He initially visited urgent care where he was diagnosed with the flu a couple of weeks ago and was prescribed an albuterol inhaler due to wheezing and the presence of some fluid in his chest, although pneumonia was ruled out.  The wheezing continues despite the use of the inhaler, which he uses several times a day as needed, particularly when he hears 'a lot of popcorn' sounds in his chest. He describes the mucus he coughs up as yellowish-brown and mentions that the majority of mucus production decreased a few days ago. He also reports a scratchy throat sensation that worsens with drinks other than water or tea.  He recalls feeling extremely tired until today, despite getting nine to ten hours of sleep. He also experienced significant fatigue after a short walk, feeling as though he had run a much longer distance.  He mentions that he took Mucinex on Saturday, which helped significantly with mucus clearance, and he continues to drink a lot of water to aid this process. He uses the inhaler as prescribed, up to six times a day, but primarily uses it based on symptoms.      Patient Active Problem List   Diagnosis Date Noted    Essential hypertension 11/05/2021   Past Medical History:  Diagnosis Date   No known health problems    Past Surgical History:  Procedure Laterality Date   NO PAST SURGERIES     Social History   Tobacco Use   Smoking status: Never   Smokeless tobacco: Never  Substance Use Topics   Alcohol use: Yes    Comment: occ   Drug use: No   Social History   Socioeconomic History   Marital status: Married    Spouse name: Not on file   Number of children: Not on file   Years of education: Not on file   Highest education level: Not on file  Occupational History   Not on file  Tobacco Use   Smoking status: Never   Smokeless tobacco: Never  Substance and Sexual Activity   Alcohol use: Yes    Comment: occ   Drug use: No   Sexual activity: Yes    Partners: Female  Other Topics Concern   Not on file  Social History Narrative   Not on file   Social Drivers of Health   Financial Resource Strain: Not on file  Food Insecurity: Not on file  Transportation Needs: Not on file  Physical Activity: Not on file  Stress: Not on file  Social Connections: Unknown (07/15/2022)   Received from [facility]   Social Network    Social Network: Not on file  Intimate Partner Violence: Unknown (07/15/2022)  Received from [facility]   HITS    Physically Hurt: Not on file    Insult or Talk Down To: Not on file    Threaten Physical Harm: Not on file    Scream or Curse: Not on file   Family Status  Relation Name Status   Mother  Deceased   Father  Alive   Son  Alive   MGM  Deceased   MGF  Deceased   PGM  Deceased   PGF  Deceased   Son  Alive   Neg Hx  (Not Specified)  No partnership data on file   Family History  Problem Relation Age of Onset   Cancer Neg Hx    Heart disease Neg Hx    No Known Allergies    Review of Systems  Constitutional:  Negative for fever and malaise/fatigue.  HENT:  Positive for congestion.   Eyes:  Negative for blurred vision.  Respiratory:   Positive for cough, sputum production and wheezing. Negative for shortness of breath.   Cardiovascular:  Negative for chest pain, palpitations and leg swelling.  Gastrointestinal:  Negative for vomiting.  Musculoskeletal:  Negative for back pain.  Skin:  Negative for rash.  Neurological:  Negative for loss of consciousness and headaches.      Objective:     BP (!) 160/110 (BP Location: Left Arm, Patient Position: Sitting, Cuff Size: Large)   Pulse 88   Temp 98.5 F (36.9 C) (Oral)   Resp 18   Ht 5' 10 (1.778 m)   Wt 251 lb 3.2 oz (113.9 kg)   SpO2 98%   BMI 36.04 kg/m  BP Readings from Last 3 Encounters:  08/10/23 (!) 160/110  03/14/23 (!) 144/86  12/07/22 (!) 146/98   Wt Readings from Last 3 Encounters:  08/10/23 251 lb 3.2 oz (113.9 kg)  03/14/23 254 lb 6 oz (115.4 kg)  12/07/22 257 lb 6 oz (116.7 kg)   SpO2 Readings from Last 3 Encounters:  08/10/23 98%  03/14/23 99%  12/07/22 96%      Physical Exam Vitals and nursing note reviewed.  Constitutional:      General: He is not in acute distress.    Appearance: Normal appearance. He is well-developed.  HENT:     Head: Normocephalic and atraumatic.  Eyes:     General: No scleral icterus.       Right eye: No discharge.        Left eye: No discharge.  Cardiovascular:     Rate and Rhythm: Normal rate and regular rhythm.     Heart sounds: No murmur heard. Pulmonary:     Effort: Pulmonary effort is normal. No respiratory distress.     Breath sounds: Wheezing present.  Musculoskeletal:        General: Normal range of motion.     Cervical back: Normal range of motion and neck supple.     Right lower leg: No edema.     Left lower leg: No edema.  Skin:    General: Skin is warm and dry.  Neurological:     Mental Status: He is alert and oriented to person, place, and time.  Psychiatric:        Mood and Affect: Mood normal.        Behavior: Behavior normal.        Thought Content: Thought content normal.         Judgment: Judgment normal.      No results found for  any visits on 08/10/23.  Last CBC Lab Results  Component Value Date   WBC 10.5 03/14/2023   HGB 13.4 03/14/2023   HCT 42.9 03/14/2023   MCV 66.2 Repeated and verified X2. (L) 03/14/2023   MCH 21.0 (L) 10/03/2019   RDW 15.3 03/14/2023   PLT 290.0 03/14/2023   Last metabolic panel Lab Results  Component Value Date   GLUCOSE 96 03/14/2023   NA 141 03/14/2023   K 4.2 03/14/2023   CL 102 03/14/2023   CO2 32 03/14/2023   BUN 15 03/14/2023   CREATININE 1.16 03/14/2023   GFR 75.64 03/14/2023   CALCIUM 9.8 03/14/2023   PROT 7.3 03/14/2023   ALBUMIN 4.4 03/14/2023   BILITOT 0.5 03/14/2023   ALKPHOS 84 03/14/2023   AST 21 03/14/2023   ALT 22 03/14/2023   Last lipids Lab Results  Component Value Date   CHOL 211 (H) 03/14/2023   HDL 38.10 (L) 03/14/2023   LDLCALC 128 (H) 03/14/2023   LDLDIRECT 131.0 10/05/2020   TRIG 224.0 (H) 03/14/2023   CHOLHDL 6 03/14/2023   Last hemoglobin A1c Lab Results  Component Value Date   HGBA1C 6.2 10/05/2020   Last thyroid functions No results found for: TSH, T3TOTAL, T4TOTAL, THYROIDAB Last vitamin D No results found for: 25OHVITD2, 25OHVITD3, VD25OH Last vitamin B12 and Folate No results found for: VITAMINB12, FOLATE    The 10-year ASCVD risk score (Arnett DK, et al., 2019) is: 11.9%    Assessment & Plan:   Problem List Items Addressed This Visit   None Visit Diagnoses       Bronchitis    -  Primary   Relevant Medications   azithromycin  (ZITHROMAX  Z-PAK) 250 MG tablet   predniSONE  (DELTASONE ) 10 MG tablet     Assessment and Plan    Post-Viral Bronchitis with Possible Secondary Bacterial Infection Persistent wheezing and chest tightness follow a recent influenza infection, with symptoms including a burning chest sensation, occasional cough with yellowish-brown sputum, and a scratchy throat. Wheezing improves with albuterol but recurs. Physical exam  reveals wheezing ('popcorn') sounds. Prednisone  is discussed for inflammation and azithromycin  for potential bacterial infection. Prescribe azithromycin  for 5 days and a prednisone  taper over 9 days. Continue albuterol inhaler as needed. Recommend Mucinex with increased water intake. Consider antihistamines like Claritin, Allegra, or Zyrtec if experiencing drainage.  Recent Influenza Infection Influenza was diagnosed two weeks ago and managed with fluids and Tylenol . Symptoms have largely resolved except for residual wheezing and chest tightness. Monitor for recurrence of flu-like symptoms. Encourage rest and hydration.  General Health Maintenance No specific health maintenance issues were discussed.        No follow-ups on file.    Simuel Stebner R Lowne Chase, DO

## 2023-08-10 NOTE — Patient Instructions (Signed)

## 2024-05-08 ENCOUNTER — Ambulatory Visit (INDEPENDENT_AMBULATORY_CARE_PROVIDER_SITE_OTHER): Admitting: Family Medicine

## 2024-05-08 VITALS — BP 146/94 | HR 89 | Temp 98.0°F | Resp 16 | Ht 70.0 in | Wt 265.0 lb

## 2024-05-08 DIAGNOSIS — Z0000 Encounter for general adult medical examination without abnormal findings: Secondary | ICD-10-CM | POA: Diagnosis not present

## 2024-05-08 DIAGNOSIS — Z1211 Encounter for screening for malignant neoplasm of colon: Secondary | ICD-10-CM | POA: Diagnosis not present

## 2024-05-08 DIAGNOSIS — Z125 Encounter for screening for malignant neoplasm of prostate: Secondary | ICD-10-CM | POA: Diagnosis not present

## 2024-05-08 MED ORDER — AMLODIPINE BESYLATE 5 MG PO TABS: 5.0000 mg | ORAL_TABLET | Freq: Every day | ORAL | 1 refills | Status: AC

## 2024-05-08 NOTE — Patient Instructions (Addendum)
 Give us  2-3 business days to get the results of your labs back.   Keep the diet clean and stay active.  Please get me a copy of your advanced directive form at your convenience.   If you do not hear anything about your referral in the next 1-2 weeks, call our office and ask for an update.  Foods that may reduce pain: 1) Ginger 2) Blueberries 3) Salmon 4) Pumpkin seeds 5) Dark chocolate 6) Turmeric 7) Tart cherries 8) Virgin olive oil 9) Chili peppers 10) Mint 11) Krill oil  Let us  know if you need anything.

## 2024-05-08 NOTE — Progress Notes (Signed)
 Chief Complaint  Patient presents with   Annual Exam    CPE    Well Male Deion Swift is here for a complete physical.   His last physical was >1 year ago.  Current diet: in general,diet is mostly healthy.   Current exercise: walking, some strength training Weight trend: stable Fatigue out of ordinary? No. Seat belt? Yes.   Advanced directive? Yes  Health maintenance Tetanus- Yes HIV- Yes Hep C- Yes  Past Medical History:  Diagnosis Date   No known health problems      Past Surgical History:  Procedure Laterality Date   NO PAST SURGERIES      Medications  Taking no meds routinely now.     Allergies No Known Allergies  Family History Family History  Problem Relation Age of Onset   Cancer Neg Hx    Heart disease Neg Hx     Review of Systems: Constitutional: no fevers or chills Eye:  no recent significant change in vision Ear/Nose/Mouth/Throat:  Ears:  no hearing loss Nose/Mouth/Throat:  no complaints of nasal congestion, no sore throat Cardiovascular:  no chest pain Respiratory:  no shortness of breath Gastrointestinal:  no abdominal pain, no change in bowel habits GU:  Male: negative for dysuria, frequency, and incontinence Musculoskeletal/Extremities:  no pain of the joints Integumentary (Skin/Breast):  no abnormal skin lesions reported Neurologic:  no headaches Endocrine: No unexpected weight changes Hematologic/Lymphatic:  no night sweats  Exam BP (!) 140/80 (BP Location: Left Arm, Patient Position: Sitting)   Pulse 89   Temp 98 F (36.7 C) (Oral)   Resp 16   Ht 5' 10 (1.778 m)   Wt 265 lb (120.2 kg)   SpO2 98%   BMI 38.02 kg/m  General:  well developed, well nourished, in no apparent distress Skin:  no significant moles, warts, or growths Head:  no masses, lesions, or tenderness Eyes:  pupils equal and round, sclera anicteric without injection Ears:  canals without lesions, TMs shiny without retraction, no obvious effusion, no  erythema Nose:  nares patent, mucosa normal Throat/Pharynx:  lips and gingiva without lesion; tongue and uvula midline; non-inflamed pharynx; no exudates or postnasal drainage Neck: neck supple without adenopathy, thyromegaly, or masses Lungs:  clear to auscultation, breath sounds equal bilaterally, no respiratory distress Cardio:  regular rate and rhythm, no bruits, no LE edema Abdomen:  abdomen soft, nontender; bowel sounds normal; no masses or organomegaly Rectal: Deferred Musculoskeletal:  symmetrical muscle groups noted without atrophy or deformity Extremities:  no clubbing, cyanosis, or edema, no deformities, no skin discoloration Neuro:  gait normal; deep tendon reflexes normal and symmetric Psych: well oriented with normal range of affect and appropriate judgment/insight  Assessment and Plan  Well adult exam - Plan: CBC, Comprehensive metabolic panel with GFR, Lipid panel, Hepatitis B surface antibody,quantitative  Screen for colon cancer - Plan: Ambulatory referral to Gastroenterology  Screening for prostate cancer - Plan: PSA   Well 48 y.o. male. Counseled on diet and exercise. Counseled on risks and benefits of prostate cancer screening with PSA. The patient agrees to undergo screening.  Advanced directive form provided today.  Flu shot politely declined. Will restart Norvasc . Monitor BP at home, f/u here if not normalizing again.  CCS: Refer GI again. Reach out if he doesn't hear anything.  Screen Hep B.  Other orders as above. Follow up in 6 mo (1 mo if BP not controlled) pending the above workup. The patient voiced understanding and agreement to the plan.  Mabel [place], DO 05/08/24 2:00 PM

## 2024-05-09 ENCOUNTER — Ambulatory Visit: Payer: Self-pay | Admitting: Family Medicine

## 2024-05-09 LAB — CBC
HCT: 41.2 % (ref 39.0–52.0)
Hemoglobin: 13 g/dL (ref 13.0–17.0)
MCHC: 31.7 g/dL (ref 30.0–36.0)
MCV: 66.3 fl — ABNORMAL LOW (ref 78.0–100.0)
Platelets: 266 K/uL (ref 150.0–400.0)
RBC: 6.22 Mil/uL — ABNORMAL HIGH (ref 4.22–5.81)
RDW: 16 % — ABNORMAL HIGH (ref 11.5–15.5)
WBC: 9.3 K/uL (ref 4.0–10.5)

## 2024-05-09 LAB — COMPREHENSIVE METABOLIC PANEL WITH GFR
ALT: 23 U/L (ref 0–53)
AST: 21 U/L (ref 0–37)
Albumin: 4.5 g/dL (ref 3.5–5.2)
Alkaline Phosphatase: 76 U/L (ref 39–117)
BUN: 12 mg/dL (ref 6–23)
CO2: 29 meq/L (ref 19–32)
Calcium: 9.1 mg/dL (ref 8.4–10.5)
Chloride: 101 meq/L (ref 96–112)
Creatinine, Ser: 1.24 mg/dL (ref 0.40–1.50)
GFR: 69.26 mL/min (ref >60.00)
Glucose, Bld: 80 mg/dL (ref 70–99)
Potassium: 4.2 meq/L (ref 3.5–5.1)
Sodium: 139 meq/L (ref 135–145)
Total Bilirubin: 0.4 mg/dL (ref 0.2–1.2)
Total Protein: 7.1 g/dL (ref 6.0–8.3)

## 2024-05-09 LAB — HEPATITIS B SURFACE ANTIBODY, QUANTITATIVE: Hep B S AB Quant (Post): <5 m[IU]/mL — ABNORMAL LOW (ref >=10)

## 2024-05-09 LAB — LIPID PANEL
Cholesterol: 210 mg/dL — ABNORMAL HIGH (ref 0–200)
HDL: 41 mg/dL (ref >39.00)
LDL Cholesterol: 136 mg/dL — ABNORMAL HIGH (ref 0–99)
NonHDL: 169.2
Total CHOL/HDL Ratio: 5
Triglycerides: 166 mg/dL — ABNORMAL HIGH (ref 0.0–149.0)
VLDL: 33.2 mg/dL (ref 0.0–40.0)

## 2024-05-09 LAB — PSA: PSA: 0.79 ng/mL (ref 0.10–4.00)

## 2024-05-17 ENCOUNTER — Encounter: Payer: Self-pay | Admitting: Gastroenterology

## 2024-06-05 ENCOUNTER — Ambulatory Visit

## 2024-06-05 VITALS — Ht 70.0 in | Wt 260.0 lb

## 2024-06-05 DIAGNOSIS — Z1211 Encounter for screening for malignant neoplasm of colon: Secondary | ICD-10-CM

## 2024-06-05 MED ORDER — NA SULFATE-K SULFATE-MG SULF 17.5-3.13-1.6 GM/177ML PO SOLN: 1.0000 | Freq: Once | ORAL | 0 refills | Status: AC

## 2024-06-05 NOTE — Progress Notes (Signed)
No egg or soy allergy known to patient  No issues known to pt with past sedation with any surgeries or procedures Patient denies ever being told they had issues or difficulty with intubation  No FH of Malignant Hyperthermia Pt is not on diet pills Pt is not on  home 02  Pt is not on blood thinners  Pt denies issues with constipation  No A fib or A flutter Have any cardiac testing pending--no Pt can ambulate independently Pt denies use of chewing tobacco Discussed diabetic I weight loss medication holds Discussed NSAID holds Checked BMI Pt instructed to use Single[employer] or GoodRx for a price reduction on prep  Patient's chart reviewed by Cathlyn Parsons CNRA prior to previsit and patient appropriate for the [facility].  Pre visit completed and red dot placed by patient's name on their procedure day (on provider's schedule).   3

## 2024-06-12 ENCOUNTER — Encounter: Payer: Self-pay | Admitting: Gastroenterology

## 2024-06-20 ENCOUNTER — Encounter: Payer: Self-pay | Admitting: Gastroenterology

## 2024-06-20 ENCOUNTER — Ambulatory Visit: Admitting: Gastroenterology

## 2024-06-20 VITALS — BP 147/93 | HR 58 | Temp 97.9°F | Resp 14 | Ht 70.0 in | Wt 260.0 lb

## 2024-06-20 DIAGNOSIS — K640 First degree hemorrhoids: Secondary | ICD-10-CM | POA: Diagnosis not present

## 2024-06-20 DIAGNOSIS — Z1211 Encounter for screening for malignant neoplasm of colon: Secondary | ICD-10-CM

## 2024-06-20 DIAGNOSIS — K5730 Diverticulosis of large intestine without perforation or abscess without bleeding: Secondary | ICD-10-CM | POA: Diagnosis not present

## 2024-06-20 MED ORDER — SODIUM CHLORIDE 0.9 % IV SOLN: 500.0000 mL | INTRAVENOUS | Status: DC

## 2024-06-20 NOTE — Progress Notes (Signed)
 Pt's states no medical or surgical changes since previsit or office visit.

## 2024-06-20 NOTE — Patient Instructions (Signed)
 Resume previous diet  Continue present medications  Repeat colonoscopy in 10 years for screening  See handouts for hemorrhoids and diverticulosis  YOU HAD AN ENDOSCOPIC PROCEDURE TODAY AT THE [facility]:   Refer to the procedure report that was given to you for any specific questions about what was found during the examination.  If the procedure report does not answer your questions, please call your gastroenterologist to clarify.  If you requested that your care partner not be given the details of your procedure findings, then the procedure report has been included in a sealed envelope for you to review at your convenience later.  YOU SHOULD EXPECT: Some feelings of bloating in the abdomen. Passage of more gas than usual.  Walking can help get rid of the air that was put into your GI tract during the procedure and reduce the bloating. If you had a lower endoscopy (such as a colonoscopy or flexible sigmoidoscopy) you may notice spotting of blood in your stool or on the toilet paper. If you underwent a bowel prep for your procedure, you may not have a normal bowel movement for a few days.  Please Note:  You might notice some irritation and congestion in your nose or some drainage.  This is from the oxygen used during your procedure.  There is no need for concern and it should clear up in a day or so.  SYMPTOMS TO REPORT IMMEDIATELY: Following lower endoscopy (colonoscopy or flexible sigmoidoscopy):  Excessive amounts of blood in the stool  Significant tenderness or worsening of abdominal pains  Swelling of the abdomen that is new, acute  Fever of 100F or higher For urgent or emergent issues, a gastroenterologist can be reached at any hour by calling [phone]. Do not use MyChart messaging for urgent concerns.   DIET:  We do recommend a small meal at first, but then you may proceed to your regular diet.  Drink plenty of fluids but you should avoid alcoholic beverages for 24  hours.  ACTIVITY:  You should plan to take it easy for the rest of today and you should NOT DRIVE or use heavy machinery until tomorrow (because of the sedation medicines used during the test).    FOLLOW UP: Our staff will call the number listed on your records the next business day following your procedure.  We will call around 7:15- 8:00 am to check on you and address any questions or concerns that you may have regarding the information given to you following your procedure. If we do not reach you, we will leave a message.     If any biopsies were taken you will be contacted by phone or by letter within the next 1-3 weeks.  Please call us  at [phone] if you have not heard about the biopsies in 3 weeks.   SIGNATURES/CONFIDENTIALITY: You and/or your care partner have signed paperwork which will be entered into your electronic medical record.  These signatures attest to the fact that that the information above on your After Visit Summary has been reviewed and is understood.  Full responsibility of the confidentiality of this discharge information lies with you and/or your care-partner.

## 2024-06-20 NOTE — Op Note (Signed)
 [facility] Patient Name: Tony Thompson Procedure Date: 06/20/2024 10:23 AM MRN: [id number] Endoscopist: Glendia E. Stacia , MD, [id number] Age: 48 Referring MD:  Date of Birth: 1976/01/11 Gender: Male Account #: [id number] Procedure:                Colonoscopy Indications:              Screening for colorectal malignant neoplasm, This                            is the patient's first colonoscopy Medicines:                Monitored Anesthesia Care Procedure:                Pre-Anesthesia Assessment:                           - Prior to the procedure, a History and Physical                            was performed, and patient medications and                            allergies were reviewed. The patient's tolerance of                            previous anesthesia was also reviewed. The risks                            and benefits of the procedure and the sedation                            options and risks were discussed with the patient.                            All questions were answered, and informed consent                            was obtained. Prior Anticoagulants: The patient has                            taken no anticoagulant or antiplatelet agents. ASA                            Grade Assessment: II - A patient with mild systemic                            disease. After reviewing the risks and benefits,                            the patient was deemed in satisfactory condition to                            undergo the procedure.  After obtaining informed consent, the colonoscope                            was passed under direct vision. Throughout the                            procedure, the patient's blood pressure, pulse, and                            oxygen saturations were monitored continuously. The                            CF [id number] #[id number] was introduced through the anus                            and advanced  to the the terminal ileum, with                            identification of the appendiceal orifice and IC                            valve. The colonoscopy was performed without                            difficulty. The patient tolerated the procedure                            well. The quality of the bowel preparation was                            good. The terminal ileum, ileocecal valve,                            appendiceal orifice, and rectum were photographed.                            The bowel preparation used was SUPREP via split                            dose instruction. Scope In: 10:30:29 AM Scope Out: 10:41:21 AM Scope Withdrawal Time: 0 hours 8 minutes 18 seconds  Total Procedure Duration: 0 hours 10 minutes 52 seconds  Findings:                 The perianal and digital rectal examinations were                            normal. Pertinent negatives include normal                            sphincter tone and no palpable rectal lesions.                           Multiple medium-mouthed and small-mouthed  diverticula were found in the sigmoid colon,                            descending colon, transverse colon and ascending                            colon. There was no evidence of diverticular                            bleeding.                           The exam was otherwise normal throughout the                            examined colon.                           The terminal ileum appeared normal.                           Non-bleeding internal hemorrhoids were found during                            retroflexion. The hemorrhoids were Grade I                            (internal hemorrhoids that do not prolapse).                           No additional abnormalities were found on                            retroflexion. Complications:            No immediate complications. Estimated Blood Loss:     Estimated blood loss:  none. Impression:               - Mild diverticulosis in the sigmoid colon, in the                            descending colon, in the transverse colon and in                            the ascending colon. There was no evidence of                            diverticular bleeding.                           - The examined portion of the ileum was normal.                           - Non-bleeding internal hemorrhoids.                           - No specimens collected. Recommendation:           -  Patient has a contact number available for                            emergencies. The signs and symptoms of potential                            delayed complications were discussed with the                            patient. Return to normal activities tomorrow.                            Written discharge instructions were provided to the                            patient.                           - Resume previous diet.                           - Continue present medications.                           - Repeat colonoscopy in 10 years for screening                            purposes. Lounette Sloan E. Stacia, MD 06/20/2024 10:47:54 AM This report has been signed electronically.

## 2024-06-20 NOTE — Progress Notes (Signed)
 [facility] History and Physical   Primary Care Physician:  Frann Mabel Mt, DO   Reason for Procedure:   Colon cancer screening  Plan:    Screening colonoscopy   HPI: Tony Thompson is a 48 y.o. male undergoing initial average risk screening colonoscopy.  He has no family history of colon cancer and no chronic GI symptoms.   The patient was provided an opportunity to ask questions and all were answered. The patient agreed with the plan    Past Medical History:  Diagnosis Date   Hypertension    No known health problems     Past Surgical History:  Procedure Laterality Date   NO PAST SURGERIES      Prior to Admission medications  Medication Sig Start Date End Date Taking? Authorizing Provider  amLODipine  (NORVASC ) 5 MG tablet Take 1 tablet (5 mg total) by mouth daily. 05/08/24  Yes Frann Mabel Mt, DO    Current Outpatient Medications  Medication Sig Dispense Refill   amLODipine  (NORVASC ) 5 MG tablet Take 1 tablet (5 mg total) by mouth daily. 90 tablet 1   Current Facility-Administered Medications  Medication Dose Route Frequency Provider Last Rate Last Admin   0.9 %  sodium chloride  infusion  500 mL Intravenous Continuous Stacia Glendia BRAVO, MD        Allergies as of 06/20/2024 - Review Complete 06/20/2024  Allergen Reaction Noted   Iodinated contrast media Nausea And Vomiting and Nausea Only 03/15/2017    Family History  Problem Relation Age of Onset   Cancer Neg Hx    Heart disease Neg Hx    Colon cancer Neg Hx    Colon polyps Neg Hx    Esophageal cancer Neg Hx    Rectal cancer Neg Hx    Stomach cancer Neg Hx     Social History   Socioeconomic History   Marital status: Married    Spouse name: Not on file   Number of children: Not on file   Years of education: Not on file   Highest education level: Bachelor's degree (e.g., BA, AB, BS)  Occupational History   Not on file  Tobacco Use   Smoking status: Never   Smokeless  tobacco: Never  Vaping Use   Vaping status: Never Used  Substance and Sexual Activity   Alcohol use: Yes    Comment: occ   Drug use: No   Sexual activity: Yes    Partners: Female  Other Topics Concern   Not on file  Social History Narrative   Not on file   Social Drivers of Health   Tobacco Use: Low Risk (06/20/2024)   Patient History    Smoking Tobacco Use: Never    Smokeless Tobacco Use: Never    Passive Exposure: Not on file  Financial Resource Strain: Low Risk (05/08/2024)   Overall Financial Resource Strain (CARDIA)    Difficulty of Paying Living Expenses: Not hard at all  Food Insecurity: No Food Insecurity (05/08/2024)   Epic    Worried About Radiation Protection Practitioner of Food in the Last Year: Never true    Ran Out of Food in the Last Year: Never true  Transportation Needs: No Transportation Needs (05/08/2024)   Epic    Lack of Transportation (Medical): No    Lack of Transportation (Non-Medical): No  Physical Activity: Insufficiently Active (05/08/2024)   Exercise Vital Sign    Days of Exercise per Week: 3 days    Minutes of Exercise per Session: 30 min  Stress: No Stress Concern Present (05/08/2024)   [employer] of Occupational Health - Occupational Stress Questionnaire    Feeling of Stress: Only a little  Social Connections: Socially Integrated (05/08/2024)   Social Connection and Isolation Panel    Frequency of Communication with Friends and Family: More than three times a week    Frequency of Social Gatherings with Friends and Family: Once a week    Attends Religious Services: More than 4 times per year    Active Member of Clubs or Organizations: Yes    Attends Banker Meetings: More than 4 times per year    Marital Status: Married  Catering Manager Violence: Not on file  Depression (PHQ2-9): Low Risk (05/08/2024)   Depression (PHQ2-9)    PHQ-2 Score: 0  Alcohol Screen: Low Risk (05/08/2024)   Alcohol Screen    Last Alcohol Screening Score (AUDIT): 2   Housing: Low Risk (05/08/2024)   Epic    Unable to Pay for Housing in the Last Year: No    Number of Times Moved in the Last Year: 0    Homeless in the Last Year: No  Utilities: Not on file  Health Literacy: Not on file    Review of Systems:  All other review of systems negative except as mentioned in the HPI.  Physical Exam: Vital signs BP (!) 151/99   Pulse 68   Temp 97.9 F (36.6 C)   Ht 5' 10 (1.778 m)   Wt 260 lb (117.9 kg)   SpO2 97%   BMI 37.31 kg/m   General:   Alert,  Well-developed, well-nourished, pleasant and cooperative in NAD Airway:  Mallampati 3 Lungs:  Clear throughout to auscultation.   Heart:  Regular rate and rhythm; no murmurs, clicks, rubs,  or gallops. Abdomen:  Soft, nontender and nondistended. Normal bowel sounds.   Neuro/Psych:  Normal mood and affect. A and O x 3   Vergia Chea E. Stacia, MD [facility]

## 2024-06-20 NOTE — Progress Notes (Signed)
 Report to PACU, RN, vss, BBS= Clear.

## 2024-06-21 ENCOUNTER — Telehealth: Payer: Self-pay

## 2024-06-21 NOTE — Telephone Encounter (Signed)
" °  Follow up Call-     06/20/2024    9:29 AM  Call back number  Post procedure Call Back phone  # [phone]  Permission to leave phone message Yes     Patient questions:  Do you have a fever, pain , or abdominal swelling? No. Pain Score  0 *  Have you tolerated food without any problems? Yes.    Have you been able to return to your normal activities? Yes.    Do you have any questions about your discharge instructions: Diet   No. Medications  No. Follow up visit  No.  Do you have questions or concerns about your Care? No.  Actions: * If pain score is 4 or above: No action needed, pain <4.   "
